# Patient Record
Sex: Female | Born: 1975 | Race: White | Hispanic: No | Marital: Married | Smoking: Never smoker
Health system: Southern US, Community
[De-identification: ages and names within clinical notes are randomized; demographics above are authoritative.]

## PROBLEM LIST (undated history)

## (undated) HISTORY — PX: APPENDECTOMY: SHX54

---

## 2004-03-06 ENCOUNTER — Other Ambulatory Visit: Admission: RE | Admit: 2004-03-06 | Discharge: 2004-03-06 | Payer: Self-pay | Admitting: Family Medicine

## 2005-03-06 ENCOUNTER — Other Ambulatory Visit: Admission: RE | Admit: 2005-03-06 | Discharge: 2005-03-06 | Payer: Self-pay | Admitting: Family Medicine

## 2006-01-04 ENCOUNTER — Ambulatory Visit (HOSPITAL_COMMUNITY): Admission: RE | Admit: 2006-01-04 | Discharge: 2006-01-04 | Payer: Self-pay | Admitting: Family Medicine

## 2006-01-04 ENCOUNTER — Ambulatory Visit: Payer: Self-pay | Admitting: Family Medicine

## 2006-01-08 ENCOUNTER — Ambulatory Visit: Payer: Self-pay | Admitting: Family Medicine

## 2006-01-15 ENCOUNTER — Ambulatory Visit: Payer: Self-pay | Admitting: Family Medicine

## 2006-10-03 ENCOUNTER — Inpatient Hospital Stay (HOSPITAL_COMMUNITY): Admission: AD | Admit: 2006-10-03 | Discharge: 2006-10-06 | Payer: Self-pay | Admitting: Obstetrics and Gynecology

## 2006-10-08 ENCOUNTER — Inpatient Hospital Stay (HOSPITAL_COMMUNITY): Admission: AD | Admit: 2006-10-08 | Discharge: 2006-10-08 | Payer: Self-pay | Admitting: Obstetrics and Gynecology

## 2007-05-05 ENCOUNTER — Ambulatory Visit: Payer: Self-pay | Admitting: Family Medicine

## 2008-05-10 ENCOUNTER — Inpatient Hospital Stay (HOSPITAL_COMMUNITY): Admission: RE | Admit: 2008-05-10 | Discharge: 2008-05-12 | Payer: Self-pay | Admitting: Obstetrics and Gynecology

## 2008-11-28 ENCOUNTER — Ambulatory Visit: Payer: Self-pay | Admitting: Family Medicine

## 2008-12-18 ENCOUNTER — Ambulatory Visit: Payer: Self-pay | Admitting: Family Medicine

## 2010-04-15 LAB — CBC
HCT: 31.4 % — ABNORMAL LOW (ref 36.0–46.0)
HCT: 36.4 % (ref 36.0–46.0)
Hemoglobin: 11.3 g/dL — ABNORMAL LOW (ref 12.0–15.0)
MCHC: 36 g/dL (ref 30.0–36.0)
MCV: 86.4 fL (ref 78.0–100.0)
Platelets: 194 10*3/uL (ref 150–400)
RBC: 3.63 MIL/uL — ABNORMAL LOW (ref 3.87–5.11)
RDW: 13.6 % (ref 11.5–15.5)
WBC: 13.5 10*3/uL — ABNORMAL HIGH (ref 4.0–10.5)

## 2010-04-15 LAB — RPR: RPR Ser Ql: NONREACTIVE

## 2010-05-20 NOTE — Discharge Summary (Signed)
NAME:  Natalie Davis, Natalie Davis NO.:  [id number]   MEDICAL RECORD NO.:  [id number]          PATIENT TYPE:  INP   LOCATION:  9115                          FACILITY:  [facility]   PHYSICIAN:  Sherron Monday, MD        DATE OF BIRTH:  Sep 04, 1975   DATE OF ADMISSION:  05/10/2008  DATE OF DISCHARGE:  05/12/2008                               DISCHARGE SUMMARY   ADMITTING DIAGNOSIS:  Intrauterine pregnancy at term with favorable  cervix.   DISCHARGE DIAGNOSIS:  Intrauterine pregnancy at term with favorable  cervix, delivered via spontaneous vaginal delivery with moderate  shoulder dystocia.   HISTORY OF PRESENT ILLNESS:  A 35 year old G3, P1-0-1-1 at 39+ weeks.  [facility] May 15, 2008 by LMP consistent with 9-week ultrasound presents  to labor and delivery for induction given 39 weeks status and favorable  cervix.  Prenatal care was uncomplicated aside from a marginal previa  that has resolved.  Positive group B strep status.   PAST MEDICAL HISTORY:  Not significant.   PAST SURGICAL HISTORY:  Significant for an appendectomy in 1999.   PAST OBSTETRICAL/GYNECOLOGICAL HISTORY:  She has had a miscarriage as  well as a vaginal delivery of 7 pounds 1 ounce infant in 2008 and as  well as the present pregnancy.  No abnormal Pap smears or sexually  transmitted diseases.   MEDICATIONS:  Prenatal vitamins and Tums.   ALLERGIES:  No known drug allergies.   SOCIAL HISTORY:  Denies alcohol, tobacco, or drug use.   PRENATAL LABORATORY DATA:  A positive, antibody screen negative, rubella  immune, hepatitis B surface antigen negative, HIV negative.  Gonorrhea  and chlamydia negative, RPR nonreactive.  Group B strep positive.  One-  hour GTT on a normal limits.  First trimester screen within normal  limits.   On admission, she is afebrile.  Vital signs stable and a benign exam  with fetal heart tones that were reactive.  An AROM was performed for  clear fluid.  She was given penicillin for group  positive, group B  strep, status post as well as Pitocin to augment her labor.  She  progressed to complete, complete and pushed for approximately 15 minutes  with the descent of vertex and delivery of the head.  She had a nuchal  cord x1 which was reduced over the head and moderate shoulder dystocia  with delivery with McRoberts and suprapubic as well as a posterior  axillary lift.  The baby with Apgars of 4 at 1 and 9 and 5 minutes and  weight of 8 pounds 4 ounces.  First-degree perineal laceration was  repaired with 2-0 Vicryl and following delivery the baby was moving her  arms well.  There was bruising noted on face and arm and back.  Her  postpartum course was relatively uncomplicated.  She remained afebrile.  Vital signs stable throughout.  Her hemoglobin decreased from 12.7-11.3.  She is A+ and rubella immune.  On the day of discharge, she was  tolerating p.o.  Her pain was well controlled.  She had normal lochia  and benign exam.  She  was  discharged home with routine discharge instructions and numbers to call  with any questions or problems.  She was given prescriptions for Motrin,  Vicodin, and prenatal vitamins.  She will follow up in approximately 6  weeks and plans to breast feed.  She will discuss contraception and  checkup with Dr. Senaida Ores.       Sherron Monday, MD  Electronically Signed     JB/MEDQ  D:  05/12/2008  T:  05/12/2008  Job:  [id number]

## 2010-05-23 NOTE — Discharge Summary (Signed)
NAME:  MEIGHAN, TRETO NO.:  [id number]   MEDICAL RECORD NO.:  [id number]          PATIENT TYPE:  INP   LOCATION:  9120                          [facility]ACILITY:  [facility]   PHYSICIAN:  Huel Cote, M.D. DATE OF BIRTH:  1975/04/12   DATE OF ADMISSION:  10/03/2006  DATE OF DISCHARGE:  10/06/2006                               DISCHARGE SUMMARY   DISCHARGE DIAGNOSES:  1. Term pregnancy at 38-5/7 weeks, delivered.  2. Status post normal spontaneous vaginal delivery.   DISCHARGE MEDICATIONS:  1. Motrin 600 mg p.o. every 6 hours.  2. Percocet 1-2 tablets p.o. every 4 hours.  3. Flexeril 10 mg p.o. x1.   DISCHARGE FOLLOWUP:  The patient is to follow up in my office in 6 weeks  for her routine postpartum exam.   HOSPITAL COURSE:  The patient is a 35 year old G2, P0-0-1-0 who was  admitted at 38-5/[redacted] weeks gestation with complaint of contractions every  3 minutes.  She was uncomfortable but was doing well without pain  medications and was trying to go natural.  Prenatal care was  uncomplicated except for positive group B strep status.  Prenatal labs  were as follows, A positive, antibody negative, RPR nonreactive, rubella  immune, hepatitis B surface antigen negative, HIV negative, GC negative,  chlamydia negative, group B strep positive, 1 hour Glucola 123.   PAST OBSTETRICAL HISTORY:  She had one spontaneous miscarriage.   PAST MEDICAL HISTORY:  None.   PAST GYN HISTORY:  None.   PAST SURGICAL HISTORY:  In 1989 she had an appendectomy.   On admission she was afebrile with stable vital signs.  Fetal heart rate  was reactive.  Contractions were every 2-8 minutes.  Cervix was 70 and 2  and a -2 station with intact membranes.  She was placed on penicillin  for a positive group B strep status.  We discussed in detail the  patient's wishes which included minimal intervention.  She did not want  rupture of membranes or Pitocin.  We discussed the option of going back  home  versus staying in the hospital with ambulation and intermittent  monitoring given that she really did not want any intervention and she  requested to stay.  She then stayed and for approximately the next 10  hours was really noted to be in latent phase labor.  She had no  intervention with pain medications or other rupture of membranes per her  request.  Cervix progressed only to 80, 2 to 3 and a -2 station.  She  was given the option at this point of discontinuing her IV and going  home with Ambien versus proceeding with rupture of membranes.  She felt  like she could not tolerate going home secondary to a pain level of 7  and agreed to rupture of membranes with Stadol and Phenergan to help her  with the pain.  Fetal heart rate was reactive.  She then had rupture of  membranes and did require some augmentation very minimally with Pitocin.  She received her epidural at 4 cm and progressed to complete dilation.  Early the next  morning at that point she began to push and pushed very  well with a normal spontaneous vaginal delivery of a vigorous female  infant over a first degree laceration.  Apgars were 7 and 9, weight was  7 pounds 1 ounce.  Placenta was delivered spontaneously.  Uterus with  several pieces of trailing membrane which were removed from the fundus.  No other fragments were noted.  First degree laceration was repaired  with 3-0 Vicryl.  Cervix and rectum were intact.  She was then admitted  for routine postpartum care.  Postpartum day #1 she was doing well  without significant complications.  Her hemoglobin was stable at 10.1,  however, on postpartum day #2 the patient was tearful regarding some  severe neck pain which began the evening of postpartum day #1.  She did  not really get any relief from Motrin or Percocet and felt most likely  she was having a significant muscle spasm.  She had no depression or  postpartum blues, just really was uncomfortable.  She was given  Flexeril  x1 to try to improve the pain and if this was not successful was  instructed that we may need to do further workup.  She did get some  relief and went home later that day.  She will follow up in the office  in 6 weeks for her routine postpartum exam.      Huel Cote, M.D.  Electronically Signed     KR/MEDQ  D:  11/17/2006  T:  11/17/2006  Job:  [id number]

## 2010-10-16 LAB — CBC
HCT: 28.5 — ABNORMAL LOW
Hemoglobin: 10.1 — ABNORMAL LOW
MCHC: 35.4
MCHC: 35.5
MCV: 89.2
RDW: 12.8
RDW: 13.1
WBC: 16.6 — ABNORMAL HIGH

## 2010-10-16 LAB — RPR: RPR Ser Ql: NONREACTIVE

## 2014-11-21 ENCOUNTER — Encounter: Payer: Self-pay | Admitting: Family

## 2014-11-21 ENCOUNTER — Ambulatory Visit: Payer: Self-pay | Admitting: Family

## 2014-11-21 VITALS — BP 100/58 | HR 64 | Temp 98.5°F

## 2014-11-21 DIAGNOSIS — L309 Dermatitis, unspecified: Secondary | ICD-10-CM

## 2014-11-21 MED ORDER — PREDNISONE 20 MG PO TABS: 40.0000 mg | ORAL_TABLET | Freq: Every day | ORAL | Status: DC

## 2014-11-21 NOTE — Progress Notes (Signed)
S/ one wk hx of pruritic rash to trunk , distal extremities, spreading , cold sxs . 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 rx prednisone 20 mg 2 qd x 7 . F/u prn

## 2015-01-09 ENCOUNTER — Ambulatory Visit: Payer: Self-pay | Admitting: Physician Assistant

## 2016-04-02 ENCOUNTER — Other Ambulatory Visit: Payer: Self-pay | Admitting: Obstetrics and Gynecology

## 2016-04-02 ENCOUNTER — Encounter: Payer: Self-pay | Admitting: Obstetrics and Gynecology

## 2016-04-02 ENCOUNTER — Ambulatory Visit (INDEPENDENT_AMBULATORY_CARE_PROVIDER_SITE_OTHER): Payer: Self-pay | Admitting: Obstetrics and Gynecology

## 2016-04-02 VITALS — BP 116/65 | HR 63 | Ht 60.0 in | Wt 134.8 lb

## 2016-04-02 DIAGNOSIS — N6019 Diffuse cystic mastopathy of unspecified breast: Secondary | ICD-10-CM | POA: Diagnosis not present

## 2016-04-02 DIAGNOSIS — Z01419 Encounter for gynecological examination (general) (routine) without abnormal findings: Secondary | ICD-10-CM

## 2016-04-02 DIAGNOSIS — Z803 Family history of malignant neoplasm of breast: Secondary | ICD-10-CM

## 2016-04-02 NOTE — Patient Instructions (Signed)
 Preventive Care 18-39 Years, Female Preventive care refers to lifestyle choices and visits with your health care provider that can promote health and wellness. What does preventive care include?  A yearly physical exam. This is also called an annual well check.  Dental exams once or twice a year.  Routine eye exams. Ask your health care provider how often you should have your eyes checked.  Personal lifestyle choices, including:  Daily care of your teeth and gums.  Regular physical activity.  Eating a healthy diet.  Avoiding tobacco and drug use.  Limiting alcohol use.  Practicing safe sex.  Taking vitamin and mineral supplements as recommended by your health care provider. What happens during an annual well check? The services and screenings done by your health care provider during your annual well check will depend on your age, overall health, lifestyle risk factors, and family history of disease. Counseling  Your health care provider may ask you questions about your:  Alcohol use.  Tobacco use.  Drug use.  Emotional well-being.  Home and relationship well-being.  Sexual activity.  Eating habits.  Work and work environment.  Method of birth control.  Menstrual cycle.  Pregnancy history. Screening  You may have the following tests or measurements:  Height, weight, and BMI.  Diabetes screening. This is done by checking your blood sugar (glucose) after you have not eaten for a while (fasting).  Blood pressure.  Lipid and cholesterol levels. These may be checked every 5 years starting at age 20.  Skin check.  Hepatitis C blood test.  Hepatitis B blood test.  Sexually transmitted disease (STD) testing.  BRCA-related cancer screening. This may be done if you have a family history of breast, ovarian, tubal, or peritoneal cancers.  Pelvic exam and Pap test. This may be done every 3 years starting at age 21. Starting at age 30, this may be done  every 5 years if you have a Pap test in combination with an HPV test. Discuss your test results, treatment options, and if necessary, the need for more tests with your health care provider. Vaccines  Your health care provider may recommend certain vaccines, such as:  Influenza vaccine. This is recommended every year.  Tetanus, diphtheria, and acellular pertussis (Tdap, Td) vaccine. You may need a Td booster every 10 years.  Varicella vaccine. You may need this if you have not been vaccinated.  HPV vaccine. If you are 26 or younger, you may need three doses over 6 months.  Measles, mumps, and rubella (MMR) vaccine. You may need at least one dose of MMR. You may also need a second dose.  Pneumococcal 13-valent conjugate (PCV13) vaccine. You may need this if you have certain conditions and were not previously vaccinated.  Pneumococcal polysaccharide (PPSV23) vaccine. You may need one or two doses if you smoke cigarettes or if you have certain conditions.  Meningococcal vaccine. One dose is recommended if you are age 19-21 years and a first-year college student living in a residence hall, or if you have one of several medical conditions. You may also need additional booster doses.  Hepatitis A vaccine. You may need this if you have certain conditions or if you travel or work in places where you may be exposed to hepatitis A.  Hepatitis B vaccine. You may need this if you have certain conditions or if you travel or work in places where you may be exposed to hepatitis B.  Haemophilus influenzae type b (Hib) vaccine. You may need   this if you have certain risk factors. Talk to your health care provider about which screenings and vaccines you need and how often you need them. This information is not intended to replace advice given to you by your health care provider. Make sure you discuss any questions you have with your health care provider. Document Released: 02/17/2001 Document Revised:  09/11/2015 Document Reviewed: 10/23/2014 Elsevier Interactive Patient Education  2017 Elsevier Inc.  

## 2016-04-02 NOTE — Progress Notes (Signed)
Subjective:   Natalie Davis is a 41 y.o. G2P2 Caucasian female here for a routine well-woman exam.  Patient's last menstrual period was 03/13/2016.    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  Past Medical History History reviewed. No pertinent past medical history.  Past Surgical History Past Surgical History:  Procedure Laterality Date  . APPENDECTOMY      Gynecologic History G2P2  Patient's last menstrual period was 03/13/2016. Contraception: vasectomy Last Pap: 2014. Results were: normal   Obstetric History OB History  Gravida Para Term Preterm AB Living  2 2          SAB TAB Ectopic Multiple Live Births               # Outcome Date GA Lbr Len/2nd Weight Sex Delivery Anes PTL Lv  2 Para 2010    F Vag-Spont     1 Para 2008    F Vag-Spont         Current Medications No current outpatient prescriptions on file prior to visit.   No current facility-administered medications on file prior to visit.     Review of Systems Patient denies any headaches, blurred vision, shortness of breath, chest pain, abdominal pain, problems with bowel movements, urination, or intercourse.  Objective:  BP 116/65   Pulse 63   Ht 5' (1.524 m)   Wt 134 lb 12.8 oz (61.1 kg)   LMP 03/13/2016   BMI 26.33 kg/m  Physical Exam  General:  Well developed, well nourished, no acute distress. She is alert and oriented x3. Skin:  Warm and dry Neck:  Midline trachea, no thyromegaly or nodules Cardiovascular: Regular rate and rhythm, no  murmur heard Lungs:  Effort normal, all lung fields clear to auscultation bilaterally Breasts:  No dominant palpable mass, retraction, or nipple discharge, fibrocystic noted bilaterally Abdomen:  Soft, non tender, no hepatosplenomegaly or masses Pelvic:  External genitalia is normal in appearance.  The vagina is normal in appearance. The cervix is bulbous, no CMT.  Thin prep pap is done with HR HPV cotesting. Uterus is felt to be normal size, shape, and contour.  No adnexal masses or tenderness noted. Extremities:  No swelling or varicosities noted Psych:  She has a normal mood and affect  Assessment:   Healthy well-woman exam Fibrocystic breast Family history of breast cancer in MGM  Plan:  Routine screening labs to be obtained at work at annual screening. F/U 1 year for AE, or sooner if needed Mammogram ordered baseline  Natalie Davis Suzan NailerN Elvenia Godden, CNM

## 2016-04-07 LAB — CYTOLOGY - PAP

## 2017-05-06 ENCOUNTER — Ambulatory Visit (INDEPENDENT_AMBULATORY_CARE_PROVIDER_SITE_OTHER): Payer: Managed Care, Other (non HMO) | Admitting: Obstetrics and Gynecology

## 2017-05-06 ENCOUNTER — Encounter: Payer: Self-pay | Admitting: Obstetrics and Gynecology

## 2017-05-06 VITALS — BP 107/56 | HR 54 | Ht 60.0 in | Wt 132.5 lb

## 2017-05-06 DIAGNOSIS — R001 Bradycardia, unspecified: Secondary | ICD-10-CM | POA: Diagnosis not present

## 2017-05-06 DIAGNOSIS — Z01419 Encounter for gynecological examination (general) (routine) without abnormal findings: Secondary | ICD-10-CM | POA: Diagnosis not present

## 2017-05-06 DIAGNOSIS — Z8249 Family history of ischemic heart disease and other diseases of the circulatory system: Secondary | ICD-10-CM | POA: Diagnosis not present

## 2017-05-06 NOTE — Progress Notes (Signed)
Subjective:   Natalie Davis is a 42 y.o. G2P2 Caucasian female here for a routine well-woman exam.  Patient's last menstrual period was 05/01/2017.    Current complaints: bio-metric screening at work revealed persistent low pulse and BP. Feels tired at times. Is exercising.and denies dizziness. Menses are monthly with cramping and heaviness the first 2 days. 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 Tobacco/alcohol: no tobacco use Illicit drugs: no history of illicit drug use  The following portions of the patient's history were reviewed and updated as appropriate: allergies, current medications, past family history, past medical history, past social history, past surgical history and problem list.  Past Medical History History reviewed. No pertinent past medical history.  Past Surgical History Past Surgical History:  Procedure Laterality Date  . APPENDECTOMY      Gynecologic History G2P2  Patient's last menstrual period was 05/01/2017. Contraception: vasectomy Last Pap: 03/2016. Results were: normal   Obstetric History OB History  Gravida Para Term Preterm AB Living  2 2          SAB TAB Ectopic Multiple Live Births               # Outcome Date GA Lbr Len/2nd Weight Sex Delivery Anes PTL Lv  2 Para 2010    F Vag-Spont     1 Para 2008    F Vag-Spont       Current Medications No current outpatient medications on file prior to visit.   No current facility-administered medications on file prior to visit.     Review of Systems Patient denies any headaches, blurred vision, shortness of breath, chest pain, abdominal pain, problems with bowel movements, urination, or intercourse.  Objective:  BP (!) 107/56   Pulse (!) 54   Ht 5' (1.524 m)   Wt 132 lb 8 oz (60.1 kg)   LMP 05/01/2017   BMI 25.88 kg/m  Physical Exam  General:   Well developed, well nourished, no acute distress. She is alert and oriented x3. Skin:  Warm and dry Neck:  Midline trachea, no thyromegaly or nodules Cardiovascular: Regular rate and rhythm, no murmur heard Lungs:  Effort normal, all lung fields clear to auscultation bilaterally Breasts:  No dominant palpable mass, retraction, or nipple discharge Abdomen:  Soft, non tender, no hepatosplenomegaly or masses Pelvic:  External genitalia is normal in appearance.  The vagina is normal in appearance. The cervix is bulbous, no CMT.  Thin prep pap is not done. Uterus is felt to be normal size, shape, and contour.  No adnexal masses or tenderness noted. Extremities:  No swelling or varicosities noted Psych:  She has a normal mood and affect  Assessment:   Healthy well-woman exam Bradycardia Family history of recurrent blood clots in sister Family history of breast cancer in MGM  Plan:  Labs obtained (at [facility]) will follow up accordingly F/U 1 year for AE, or sooner if needed   Danae Oland Suzan Nailer, CNM

## 2017-05-06 NOTE — Patient Instructions (Signed)
Bradycardia, Adult °Bradycardia is a slower-than-normal heartbeat. A normal resting heart rate for an adult ranges from 60 to 100 beats per minute. With bradycardia, the resting heart rate is less than 60 beats per minute. °Bradycardia can prevent enough oxygen from reaching certain areas of your body when you are active. It can be serious if it keeps enough oxygen from reaching your brain and other parts of your body. Bradycardia is not a problem for everyone. For some healthy adults, a slow resting heart rate is normal. °What are the causes? °This condition may be caused by: °· A problem with the heart, including: °? A problem with the heart's electrical system, such as a heart block. °? A problem with the heart's natural pacemaker (sinus node). °? Heart disease. °? A heart attack. °? Heart damage. °? A heart infection. °? A heart condition that is present at birth (congenital heart defect). °· Certain medicines that treat heart conditions. °· Certain conditions, such as hypothyroidism and obstructive sleep apnea. °· Problems with the balance of chemicals and other substances, like potassium, in the blood. ° °What increases the risk? °This condition is more likely to develop in adults who: °· Are age 65 or older. °· Have high blood pressure (hypertension), high cholesterol (hyperlipidemia), or diabetes. °· Drink heavily, use tobacco or nicotine products, or use drugs. °· Are stressed. ° °What are the signs or symptoms? °Symptoms of this condition include: °· Light-headedness. °· Feeling faint or fainting. °· Fatigue and weakness. °· Shortness of breath. °· Chest pain (angina). °· Drowsiness. °· Confusion. °· Dizziness. ° °How is this diagnosed? °This condition may be diagnosed based on: °· Your symptoms. °· Your medical history. °· A physical exam. ° °During the exam, your health care provider will listen to your heartbeat and check your pulse. To confirm the diagnosis, your health care provider may order tests,  such as: °· Blood tests. °· An electrocardiogram (ECG). This test records the heart's electrical activity. The test can show how fast your heart is beating and whether the heartbeat is steady. °· A test in which you wear a portable device (event recorder or Holter monitor) to record your heart's electrical activity while you go about your day. °· An exercise test. ° °How is this treated? °Treatment for this condition depends on the cause of the condition and how severe your symptoms are. Treatment may involve: °· Treatment of the underlying condition. °· Changing your medicines or how much medicine you take. °· Having a small, battery-operated device called a pacemaker implanted under the skin. When bradycardia occurs, this device can be used to increase your heart rate and help your heart to beat in a regular rhythm. ° °Follow these instructions at home: °Lifestyle ° °· Manage any health conditions that contribute to bradycardia as told by your health care provider. °· Follow a heart-healthy diet. A nutrition specialist (dietitian) can help to educate you about healthy food options and changes. °· Follow an exercise program that is approved by your health care provider. °· Maintain a healthy weight. °· Try to reduce or manage your stress, such as with yoga or meditation. If you need help reducing stress, ask your health care provider. °· Do not use use any products that contain nicotine or tobacco, such as cigarettes and e-cigarettes. If you need help quitting, ask your health care provider. °· Do not use illegal drugs. °· Limit alcohol intake to no more than 1 drink per day for nonpregnant women and 2 drinks per   day for men. One drink equals 12 oz of beer, 5 oz of wine, or 1½ oz of hard liquor. °General instructions °· Take over-the-counter and prescription medicines only as told by your health care provider. °· Keep all follow-up visits as directed by your health care provider. This is important. °How is this  prevented? °In some cases, bradycardia may be prevented by: °· Treating underlying medical problems. °· Stopping behaviors or medicines that can trigger the condition. ° °Contact a health care provider if: °· You feel light-headed or dizzy. °· You almost faint. °· You feel weak or are easily fatigued during physical activity. °· You experience confusion or have memory problems. °Get help right away if: °· You faint. °· You have an irregular heartbeat (palpitations). °· You have chest pain. °· You have trouble breathing. °This information is not intended to replace advice given to you by your health care provider. Make sure you discuss any questions you have with your health care provider. °Document Released: 09/13/2001 Document Revised: 08/20/2015 Document Reviewed: 06/13/2015 °Elsevier Interactive Patient Education © 2017 Elsevier Inc. ° °

## 2017-05-19 ENCOUNTER — Other Ambulatory Visit: Payer: Self-pay

## 2017-05-19 DIAGNOSIS — Z8249 Family history of ischemic heart disease and other diseases of the circulatory system: Secondary | ICD-10-CM

## 2017-05-19 DIAGNOSIS — R001 Bradycardia, unspecified: Secondary | ICD-10-CM

## 2017-05-20 ENCOUNTER — Other Ambulatory Visit: Payer: Self-pay | Admitting: Obstetrics and Gynecology

## 2017-05-20 ENCOUNTER — Telehealth: Payer: Self-pay | Admitting: *Deleted

## 2017-05-20 DIAGNOSIS — E559 Vitamin D deficiency, unspecified: Secondary | ICD-10-CM

## 2017-05-20 LAB — CBC WITH DIFFERENTIAL/PLATELET
BASOS ABS: 0 10*3/uL (ref 0.0–0.2)
BASOS: 0 %
EOS (ABSOLUTE): 0.2 10*3/uL (ref 0.0–0.4)
Eos: 4 %
Hematocrit: 42.8 % (ref 34.0–46.6)
Hemoglobin: 14 g/dL (ref 11.1–15.9)
IMMATURE GRANULOCYTES: 0 %
Immature Grans (Abs): 0 10*3/uL (ref 0.0–0.1)
LYMPHS: 37 %
Lymphocytes Absolute: 2.2 10*3/uL (ref 0.7–3.1)
MCH: 30.2 pg (ref 26.6–33.0)
MCHC: 32.7 g/dL (ref 31.5–35.7)
MCV: 92 fL (ref 79–97)
MONOCYTES: 5 %
Monocytes Absolute: 0.3 10*3/uL (ref 0.1–0.9)
NEUTROS ABS: 3.2 10*3/uL (ref 1.4–7.0)
NEUTROS PCT: 54 %
Platelets: 302 10*3/uL (ref 150–379)
RBC: 4.64 x10E6/uL (ref 3.77–5.28)
RDW: 12.9 % (ref 12.3–15.4)
WBC: 5.9 10*3/uL (ref 3.4–10.8)

## 2017-05-20 LAB — COMPREHENSIVE METABOLIC PANEL
A/G RATIO: 2 (ref 1.2–2.2)
ALT: 9 IU/L (ref 0–32)
AST: 17 IU/L (ref 0–40)
Albumin: 4.7 g/dL (ref 3.5–5.5)
Alkaline Phosphatase: 43 IU/L (ref 39–117)
BILIRUBIN TOTAL: 0.5 mg/dL (ref 0.0–1.2)
BUN/Creatinine Ratio: 13 (ref 9–23)
BUN: 10 mg/dL (ref 6–20)
CALCIUM: 9.4 mg/dL (ref 8.7–10.2)
CHLORIDE: 102 mmol/L (ref 96–106)
CO2: 20 mmol/L (ref 20–29)
Creatinine, Ser: 0.78 mg/dL (ref 0.57–1.00)
GFR calc Af Amer: 111 mL/min/{1.73_m2} (ref >59)
GFR calc non Af Amer: 96 mL/min/{1.73_m2} (ref >59)
GLUCOSE: 81 mg/dL (ref 65–99)
Globulin, Total: 2.4 g/dL (ref 1.5–4.5)
POTASSIUM: 4.2 mmol/L (ref 3.5–5.2)
Sodium: 140 mmol/L (ref 134–144)
Total Protein: 7.1 g/dL (ref 6.0–8.5)

## 2017-05-20 LAB — B12 AND FOLATE PANEL
Folate: >20 ng/mL (ref >3.0)
Vitamin B-12: 370 pg/mL (ref 232–1245)

## 2017-05-20 LAB — PT AND PTT

## 2017-05-20 LAB — FERRITIN: FERRITIN: 31 ng/mL (ref 15–150)

## 2017-05-20 LAB — FIBRINOGEN

## 2017-05-20 LAB — LIPID PANEL
CHOLESTEROL TOTAL: 183 mg/dL (ref 100–199)
Chol/HDL Ratio: 2.6 ratio (ref 0.0–4.4)
HDL: 70 mg/dL (ref >39)
LDL Calculated: 90 mg/dL (ref 0–99)
Triglycerides: 116 mg/dL (ref 0–149)
VLDL Cholesterol Cal: 23 mg/dL (ref 5–40)

## 2017-05-20 LAB — VITAMIN D 25 HYDROXY (VIT D DEFICIENCY, FRACTURES): VIT D 25 HYDROXY: 19.9 ng/mL — AB (ref 30.0–100.0)

## 2017-05-20 LAB — THYROID PANEL WITH TSH
Free Thyroxine Index: 1.7 (ref 1.2–4.9)
T3 Uptake Ratio: 21 % — ABNORMAL LOW (ref 24–39)
T4, Total: 7.9 ug/dL (ref 4.5–12.0)
TSH: 2.75 u[IU]/mL (ref 0.450–4.500)

## 2017-05-20 MED ORDER — VITAMIN D (ERGOCALCIFEROL) 1.25 MG (50000 UNIT) PO CAPS: 50000.0000 [IU] | ORAL_CAPSULE | ORAL | 1 refills | Status: DC

## 2017-05-20 NOTE — Telephone Encounter (Signed)
-----   Message from Purcell Nails, [place] sent at 05/20/2017  8:23 AM EDT ----- Please mail infor on vit D and schedule recheck lab in 3 months

## 2017-05-20 NOTE — Telephone Encounter (Signed)
Mailed all info 

## 2017-09-01 ENCOUNTER — Telehealth: Payer: Self-pay | Admitting: Obstetrics and Gynecology

## 2017-09-01 NOTE — Telephone Encounter (Signed)
The patient sent a MyChart Schedule Message; see below:  Comments:  I just need an order to take over to employee health for vitamin d recheck.    Please advise, thanks

## 2017-09-02 ENCOUNTER — Other Ambulatory Visit: Payer: Self-pay | Admitting: *Deleted

## 2017-09-02 ENCOUNTER — Encounter: Payer: Self-pay | Admitting: *Deleted

## 2017-09-02 DIAGNOSIS — E559 Vitamin D deficiency, unspecified: Secondary | ICD-10-CM

## 2018-05-12 ENCOUNTER — Encounter: Payer: Managed Care, Other (non HMO) | Admitting: Obstetrics and Gynecology

## 2018-06-21 ENCOUNTER — Ambulatory Visit (INDEPENDENT_AMBULATORY_CARE_PROVIDER_SITE_OTHER): Payer: Managed Care, Other (non HMO) | Admitting: Obstetrics and Gynecology

## 2018-06-21 ENCOUNTER — Encounter: Payer: Self-pay | Admitting: Obstetrics and Gynecology

## 2018-06-21 ENCOUNTER — Other Ambulatory Visit: Payer: Self-pay

## 2018-06-21 VITALS — BP 112/65 | HR 52 | Ht 60.0 in | Wt 133.7 lb

## 2018-06-21 DIAGNOSIS — Z01419 Encounter for gynecological examination (general) (routine) without abnormal findings: Secondary | ICD-10-CM

## 2018-06-21 NOTE — Progress Notes (Signed)
Subjective:   Natalie Davis is a 43 y.o. G9P2 Caucasian female here for a routine well-woman exam.  Patient's last menstrual period was 05/31/2018.    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 Tobacco/alcohol: no tobacco use Illicit drugs: no history of illicit drug use  The following portions of the patient's history were reviewed and updated as appropriate: allergies, current medications, past family history, past medical history, past social history, past surgical history and problem list.  Past Medical History History reviewed. No pertinent past medical history.  Past Surgical History Past Surgical History:  Procedure Laterality Date  . APPENDECTOMY      Gynecologic History G2P2  Patient's last menstrual period was 05/31/2018. Contraception: vasectomy Last Pap: 03/2016. Results were: normal   Obstetric History OB History  Gravida Para Term Preterm AB Living  2 2          SAB TAB Ectopic Multiple Live Births               # Outcome Date GA Lbr Len/2nd Weight Sex Delivery Anes PTL Lv  2 Para 2010    F Vag-Spont     1 Para 2008    F Vag-Spont       Current Medications Current Outpatient Medications on File Prior to Visit  Medication Sig Dispense Refill  . Vitamin D, Ergocalciferol, (DRISDOL) 50000 units CAPS capsule Take 1 capsule (50,000 Units total) by mouth 2 (two) times a week. (Patient not taking: Reported on 06/21/2018) 30 capsule 1   No current facility-administered medications on file prior to visit.     Review of Systems Patient denies any headaches, blurred vision, shortness of breath, chest pain, abdominal pain, problems with bowel movements, urination, or intercourse.  Objective:  BP 112/65   Pulse (!) 52   Ht 5' (1.524 m)   Wt 133 lb 11.2 oz (60.6 kg)   LMP 05/31/2018   BMI 26.11  kg/m  Physical Exam  General:  Well developed, well nourished, no acute distress. She is alert and oriented x3. Skin:  Warm and dry Neck:  Midline trachea, no thyromegaly or nodules Cardiovascular: Regular rate and rhythm, no murmur heard Lungs:  Effort normal, all lung fields clear to auscultation bilaterally Breasts:  No dominant palpable mass, retraction, or nipple discharge Abdomen:  Soft, non tender, no hepatosplenomegaly or masses Pelvic:  External genitalia is normal in appearance.  The vagina is normal in appearance. The cervix is bulbous, no CMT.  Thin prep pap is not done. Uterus is felt to be normal size, shape, and contour.  No adnexal masses or tenderness noted. Extremities:  No swelling or varicosities noted Psych:  She has a normal mood and affect  Assessment:   Healthy well-woman exam Vit D deficiency  Plan:  Labs obtained through work, orders sent with her.  Discussed de-stressing after work. F/U 1 year for AE, or sooner if needed Mammogram ordered  Juanluis Guastella Rockney Ghee, CNM

## 2018-06-21 NOTE — Patient Instructions (Signed)
Place annual gynecologic exam patient instructions here.

## 2018-08-12 ENCOUNTER — Other Ambulatory Visit: Payer: Managed Care, Other (non HMO)

## 2018-08-12 ENCOUNTER — Other Ambulatory Visit: Payer: Self-pay

## 2018-08-12 DIAGNOSIS — Z008 Encounter for other general examination: Secondary | ICD-10-CM | POA: Diagnosis not present

## 2018-08-12 DIAGNOSIS — Z01419 Encounter for gynecological examination (general) (routine) without abnormal findings: Secondary | ICD-10-CM | POA: Diagnosis not present

## 2018-08-12 NOTE — Progress Notes (Signed)
Patient had a physical completed with her GYN. Biometric Form was filled out and Labs with orders were drawn.

## 2018-08-13 LAB — LIPID PANEL
Chol/HDL Ratio: 2.8 ratio (ref 0.0–4.4)
Cholesterol, Total: 180 mg/dL (ref 100–199)
HDL: 64 mg/dL (ref >39)
LDL Calculated: 97 mg/dL (ref 0–99)
Triglycerides: 93 mg/dL (ref 0–149)
VLDL Cholesterol Cal: 19 mg/dL (ref 5–40)

## 2018-08-13 LAB — TSH: TSH: 3.71 u[IU]/mL (ref 0.450–4.500)

## 2018-08-13 LAB — COMPREHENSIVE METABOLIC PANEL
ALT: 12 IU/L (ref 0–32)
AST: 17 IU/L (ref 0–40)
Albumin/Globulin Ratio: 2.1 (ref 1.2–2.2)
Albumin: 4.7 g/dL (ref 3.8–4.8)
Alkaline Phosphatase: 41 IU/L (ref 39–117)
BUN/Creatinine Ratio: 26 — ABNORMAL HIGH (ref 9–23)
BUN: 19 mg/dL (ref 6–24)
Bilirubin Total: 0.3 mg/dL (ref 0.0–1.2)
CO2: 20 mmol/L (ref 20–29)
Calcium: 9.5 mg/dL (ref 8.7–10.2)
Chloride: 105 mmol/L (ref 96–106)
Creatinine, Ser: 0.74 mg/dL (ref 0.57–1.00)
GFR calc Af Amer: 117 mL/min/{1.73_m2} (ref >59)
GFR calc non Af Amer: 102 mL/min/{1.73_m2} (ref >59)
Globulin, Total: 2.2 g/dL (ref 1.5–4.5)
Glucose: 88 mg/dL (ref 65–99)
Potassium: 4.6 mmol/L (ref 3.5–5.2)
Sodium: 139 mmol/L (ref 134–144)
Total Protein: 6.9 g/dL (ref 6.0–8.5)

## 2018-08-13 LAB — HGB A1C W/O EAG: Hgb A1c MFr Bld: 4.8 % (ref 4.8–5.6)

## 2018-08-13 LAB — VITAMIN D 25 HYDROXY (VIT D DEFICIENCY, FRACTURES): Vit D, 25-Hydroxy: 27.4 ng/mL — ABNORMAL LOW (ref 30.0–100.0)

## 2018-08-17 ENCOUNTER — Other Ambulatory Visit: Payer: Self-pay | Admitting: Obstetrics and Gynecology

## 2018-08-17 MED ORDER — VITAMIN D (ERGOCALCIFEROL) 1.25 MG (50000 UNIT) PO CAPS: 50000.0000 [IU] | ORAL_CAPSULE | ORAL | 1 refills | Status: DC

## 2018-11-23 ENCOUNTER — Other Ambulatory Visit: Payer: Self-pay | Admitting: Certified Nurse Midwife

## 2018-11-23 ENCOUNTER — Other Ambulatory Visit: Payer: Self-pay | Admitting: Family

## 2018-11-23 ENCOUNTER — Ambulatory Visit
Admission: RE | Admit: 2018-11-23 | Discharge: 2018-11-23 | Disposition: A | Discharge status: Home / Self Care | Payer: Managed Care, Other (non HMO) | Source: Ambulatory Visit | Attending: Obstetrics and Gynecology | Admitting: Obstetrics and Gynecology

## 2018-11-23 DIAGNOSIS — R928 Other abnormal and inconclusive findings on diagnostic imaging of breast: Secondary | ICD-10-CM

## 2018-11-23 DIAGNOSIS — Z1231 Encounter for screening mammogram for malignant neoplasm of breast: Secondary | ICD-10-CM | POA: Insufficient documentation

## 2018-11-23 DIAGNOSIS — Z01419 Encounter for gynecological examination (general) (routine) without abnormal findings: Secondary | ICD-10-CM

## 2018-11-23 DIAGNOSIS — N6489 Other specified disorders of breast: Secondary | ICD-10-CM

## 2018-11-30 ENCOUNTER — Ambulatory Visit
Admission: RE | Admit: 2018-11-30 | Discharge: 2018-11-30 | Disposition: A | Discharge status: Home / Self Care | Payer: Managed Care, Other (non HMO) | Source: Ambulatory Visit | Attending: Certified Nurse Midwife | Admitting: Certified Nurse Midwife

## 2018-11-30 DIAGNOSIS — R928 Other abnormal and inconclusive findings on diagnostic imaging of breast: Secondary | ICD-10-CM | POA: Diagnosis present

## 2018-11-30 DIAGNOSIS — N6489 Other specified disorders of breast: Secondary | ICD-10-CM | POA: Diagnosis present

## 2019-03-27 ENCOUNTER — Encounter: Payer: Self-pay | Admitting: Certified Nurse Midwife

## 2019-03-27 ENCOUNTER — Ambulatory Visit: Payer: Managed Care, Other (non HMO) | Admitting: Certified Nurse Midwife

## 2019-03-27 ENCOUNTER — Other Ambulatory Visit: Payer: Self-pay

## 2019-03-27 VITALS — BP 100/53 | HR 67 | Ht 60.0 in | Wt 138.1 lb

## 2019-03-27 DIAGNOSIS — F419 Anxiety disorder, unspecified: Secondary | ICD-10-CM

## 2019-03-27 NOTE — Progress Notes (Signed)
Pt present for anxiety. Pt GAD-7= 15. Pt stated that she has a lot going on right now and is having a lot of anxiety.

## 2019-03-27 NOTE — Progress Notes (Signed)
GYN ENCOUNTER NOTE  Subjective:       Natalie Davis is a 44 y.o. G2P2 female here for management of anxiety.   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 Reports difficulty relaxing, inability to feel settles, scattered thoughts and difficulty focusing and trouble sleeping for the last few months.   No SI/HI.    Gynecologic History  Patient's last menstrual period was 03/03/2019.  Contraception: vasectomy  Last Pap: 03/2016. Results were: Negative/Negative  Last mammogram: 11/2018. Results were: abnormal, BI-RADS 3, follow up in six (6) months  Obstetric History  OB History  Gravida Para Term Preterm AB Living  2 2          SAB TAB Ectopic Multiple Live Births               # Outcome Date GA Lbr Len/2nd Weight Sex Delivery Anes PTL Lv  2 Para 2010    F Vag-Spont     1 Para 2008    F Vag-Spont       History reviewed. No pertinent past medical history.  Past Surgical History:  Procedure Laterality Date  . APPENDECTOMY      Current Outpatient Medications on File Prior to Visit  Medication Sig Dispense Refill  . Vitamin D, Ergocalciferol, (DRISDOL) 1.25 MG (50000 UT) CAPS capsule Take 1 capsule (50,000 Units total) by mouth 2 (two) times a week. 30 capsule 1   No current facility-administered medications on file prior to visit.    No Known Allergies  Social History   Socioeconomic History  . Marital status: Married    Spouse name: Not on file  . Number of children: Not on file  . Years of education: Not on file  . Highest education level: Not on file  Occupational History  . Not on file  Tobacco Use  . Smoking status: Never Smoker  . Smokeless tobacco: Never Used  Substance and Sexual Activity  . Alcohol use: Yes    Alcohol/week: 0.0 standard drinks    Comment: occas  . Drug use: No  . Sexual activity: Yes    Comment: husband vasectomy  Other Topics Concern  . Not on file  Social History Narrative  . Not on file   Social  Determinants of Health   Financial Resource Strain:   . Difficulty of Paying Living Expenses:   Food Insecurity:   . Worried About Charity fundraiser in the Last Year:   . Arboriculturist in the Last Year:   Transportation Needs:   . Film/video editor (Medical):   Marland Kitchen Lack of Transportation (Non-Medical):   Physical Activity:   . Days of Exercise per Week:   . Minutes of Exercise per Session:   Stress:   . Feeling of Stress :   Social Connections:   . Frequency of Communication with Friends and Family:   . Frequency of Social Gatherings with Friends and Family:   . Attends Religious Services:   . Active Member of Clubs or Organizations:   . Attends Archivist Meetings:   Marland Kitchen Marital Status:   Intimate Partner Violence:   . Fear of Current or Ex-Partner:   . Emotionally Abused:   Marland Kitchen Physically Abused:   . Sexually Abused:     Family History  Problem Relation Age of Onset  . Emphysema Paternal Grandfather   . Breast cancer Maternal Grandmother     The following portions of the patient's history were reviewed and updated  as appropriate: allergies, current medications, past family history, past medical history, past social history, past surgical history and problem list.  Review of Systems  ROS negative except as noted above. Information obtained from patient.   Objective:   BP (!) 100/53   Pulse 67   Ht 5' (1.524 m)   Wt 138 lb 1.6 oz (62.6 kg)   LMP 03/03/2019   BMI 26.97 kg/m    CONSTITUTIONAL: Well-developed, well-nourished female in no acute distress.   PHYSICAL EXAM: Not indicated.   GAD 7 : Generalized Anxiety Score 03/27/2019  Nervous, Anxious, on Edge 2  Control/stop worrying 2  Worry too much - different things 2  Trouble relaxing 3  Restless 3  Easily annoyed or irritable 2  Afraid - awful might happen 1  Total GAD 7 Score 15  Anxiety Difficulty Somewhat difficult    Assessment:   1. Anxiety  Plan:   Discussed options for  management of anxiety including self care, counseling, and medication.   Handouts provided. Patient will research medications and contact via MyChart with desired option.   Encouraged to contact [facility].   Reviewed red flag symptoms and when to call.   RTC x 4-6 weeks for televisit medication check or sooner if needed.    Gunnar Bulla, CNM [facility], [facility] 03/27/19 5:24 PM

## 2019-03-27 NOTE — Patient Instructions (Signed)
Hydroxyzine capsules or tablets What is this medicine? HYDROXYZINE (hye [place] i zeen) is an antihistamine. This medicine is used to treat allergy symptoms. It is also used to treat anxiety and tension. This medicine can be used with other medicines to induce sleep before surgery. This medicine may be used for other purposes; ask your health care provider or pharmacist if you have questions. COMMON BRAND NAME(S): ANX, Atarax, Rezine, Vistaril What should I tell my health care provider before I take this medicine? They need to know if you have any of these conditions:  glaucoma  heart disease  history of irregular heartbeat  kidney disease  liver disease  lung or breathing disease, like asthma  stomach or intestine problems  thyroid disease  trouble passing urine  an unusual or allergic reaction to hydroxyzine, cetirizine, other medicines, foods, dyes or preservatives  pregnant or trying to get pregnant  breast-feeding How should I use this medicine? Take this medicine by mouth with a full glass of water. Follow the directions on the prescription label. You may take this medicine with food or on an empty stomach. Take your medicine at regular intervals. Do not take your medicine more often than directed. Talk to your pediatrician regarding the use of this medicine in children. Special care may be needed. While this drug may be prescribed for children as young as 73 years of age for selected conditions, precautions do apply. Patients over 36 years old may have a stronger reaction and need a smaller dose. Overdosage: If you think you have taken too much of this medicine contact a poison control center or emergency room at once. NOTE: This medicine is only for you. Do not share this medicine with others. What if I miss a dose? If you miss a dose, take it as soon as you can. If it is almost time for your next dose, take only that dose. Do not take double or extra doses. What may  interact with this medicine? Do not take this medicine with any of the following medications:  cisapride  dronedarone  pimozide  thioridazine This medicine may also interact with the following medications:  alcohol  antihistamines for allergy, cough, and cold  atropine  barbiturate medicines for sleep or seizures, like phenobarbital  certain antibiotics like erythromycin or clarithromycin  certain medicines for anxiety or sleep  certain medicines for bladder problems like oxybutynin, tolterodine  certain medicines for depression or psychotic disturbances  certain medicines for irregular heart beat  certain medicines for Parkinson's disease like benztropine, trihexyphenidyl  certain medicines for seizures like phenobarbital, primidone  certain medicines for stomach problems like dicyclomine, hyoscyamine  certain medicines for travel sickness like scopolamine  ipratropium  narcotic medicines for pain  other medicines that prolong the QT interval (an abnormal heart rhythm) like dofetilide This list may not describe all possible interactions. Give your health care provider a list of all the medicines, herbs, non-prescription drugs, or dietary supplements you use. Also tell them if you smoke, drink alcohol, or use illegal drugs. Some items may interact with your medicine. What should I watch for while using this medicine? Tell your doctor or health care professional if your symptoms do not improve. You may get drowsy or dizzy. Do not drive, use machinery, or do anything that needs mental alertness until you know how this medicine affects you. Do not stand or sit up quickly, especially if you are an older patient. This reduces the risk of dizzy or fainting spells. Alcohol may  interfere with the effect of this medicine. Avoid alcoholic drinks. Your mouth may get dry. Chewing sugarless gum or sucking hard candy, and drinking plenty of water may help. Contact your doctor if the  problem does not go away or is severe. This medicine may cause dry eyes and blurred vision. If you wear contact lenses you may feel some discomfort. Lubricating drops may help. See your eye doctor if the problem does not go away or is severe. If you are receiving skin tests for allergies, tell your doctor you are using this medicine. What side effects may I notice from receiving this medicine? Side effects that you should report to your doctor or health care professional as soon as possible:  allergic reactions like skin rash, itching or hives, swelling of the face, lips, or tongue  changes in vision  confusion  fast, irregular heartbeat  seizures  tremor  trouble passing urine or change in the amount of urine Side effects that usually do not require medical attention (report to your doctor or health care professional if they continue or are bothersome):  constipation  drowsiness  dry mouth  headache  tiredness This list may not describe all possible side effects. Call your doctor for medical advice about side effects. You may report side effects to FDA at 1-800-FDA-1088. Where should I keep my medicine? Keep out of the reach of children. Store at room temperature between 15 and 30 degrees C (59 and 86 degrees F). Keep container tightly closed. Throw away any unused medicine after the expiration date. NOTE: This sheet is a summary. It may not cover all possible information. If you have questions about this medicine, talk to your doctor, pharmacist, or health care provider.  2020 Elsevier/Gold Standard (2017-12-13 13:19:55)   Escitalopram tablets What is this medicine? ESCITALOPRAM (es sye TAL oh pram) is used to treat depression and certain types of anxiety. This medicine may be used for other purposes; ask your health care provider or pharmacist if you have questions. COMMON BRAND NAME(S): Lexapro What should I tell my health care provider before I take this  medicine? They need to know if you have any of these conditions:  bipolar disorder or a family history of bipolar disorder  diabetes  glaucoma  heart disease  kidney or liver disease  receiving electroconvulsive therapy  seizures (convulsions)  suicidal thoughts, plans, or attempt by you or a family member  an unusual or allergic reaction to escitalopram, the related drug citalopram, other medicines, foods, dyes, or preservatives  pregnant or trying to become pregnant  breast-feeding How should I use this medicine? Take this medicine by mouth with a glass of water. Follow the directions on the prescription label. You can take it with or without food. If it upsets your stomach, take it with food. Take your medicine at regular intervals. Do not take it more often than directed. Do not stop taking this medicine suddenly except upon the advice of your doctor. Stopping this medicine too quickly may cause serious side effects or your condition may worsen. A special MedGuide will be given to you by the pharmacist with each prescription and refill. Be sure to read this information carefully each time. Talk to your pediatrician regarding the use of this medicine in children. Special care may be needed. Overdosage: If you think you have taken too much of this medicine contact a poison control center or emergency room at once. NOTE: This medicine is only for you. Do not share this medicine  with others. What if I miss a dose? If you miss a dose, take it as soon as you can. If it is almost time for your next dose, take only that dose. Do not take double or extra doses. What may interact with this medicine? Do not take this medicine with any of the following medications:  certain medicines for fungal infections like fluconazole, itraconazole, ketoconazole, posaconazole, voriconazole  cisapride  citalopram  dronedarone  linezolid  MAOIs like Carbex, Eldepryl, Marplan, Nardil, and  Parnate  methylene blue (injected into a vein)  pimozide  thioridazine This medicine may also interact with the following medications:  alcohol  amphetamines  aspirin and aspirin-like medicines  carbamazepine  certain medicines for depression, anxiety, or psychotic disturbances  certain medicines for migraine headache like almotriptan, eletriptan, frovatriptan, naratriptan, rizatriptan, sumatriptan, zolmitriptan  certain medicines for sleep  certain medicines that treat or prevent blood clots like warfarin, enoxaparin, dalteparin  cimetidine  diuretics  dofetilide  fentanyl  furazolidone  isoniazid  lithium  metoprolol  NSAIDs, medicines for pain and inflammation, like ibuprofen or naproxen  other medicines that prolong the QT interval (cause an abnormal heart rhythm)  procarbazine  rasagiline  supplements like St. John's wort, kava kava, valerian  tramadol  tryptophan  ziprasidone This list may not describe all possible interactions. Give your health care provider a list of all the medicines, herbs, non-prescription drugs, or dietary supplements you use. Also tell them if you smoke, drink alcohol, or use illegal drugs. Some items may interact with your medicine. What should I watch for while using this medicine? Tell your doctor if your symptoms do not get better or if they get worse. Visit your doctor or health care professional for regular checks on your progress. Because it may take several weeks to see the full effects of this medicine, it is important to continue your treatment as prescribed by your doctor. Patients and their families should watch out for new or worsening thoughts of suicide or depression. Also watch out for sudden changes in feelings such as feeling anxious, agitated, panicky, irritable, hostile, aggressive, impulsive, severely restless, overly excited and hyperactive, or not being able to sleep. If this happens, especially at the  beginning of treatment or after a change in dose, call your health care professional. Dennis Bast may get drowsy or dizzy. Do not drive, use machinery, or do anything that needs mental alertness until you know how this medicine affects you. Do not stand or sit up quickly, especially if you are an older patient. This reduces the risk of dizzy or fainting spells. Alcohol may interfere with the effect of this medicine. Avoid alcoholic drinks. Your mouth may get dry. Chewing sugarless gum or sucking hard candy, and drinking plenty of water may help. Contact your doctor if the problem does not go away or is severe. What side effects may I notice from receiving this medicine? Side effects that you should report to your doctor or health care professional as soon as possible:  allergic reactions like skin rash, itching or hives, swelling of the face, lips, or tongue  anxious  black, tarry stools  changes in vision  confusion  elevated mood, decreased need for sleep, racing thoughts, impulsive behavior  eye pain  fast, irregular heartbeat  feeling faint or lightheaded, falls  feeling agitated, angry, or irritable  hallucination, loss of contact with reality  loss of balance or coordination  loss of memory  painful or prolonged erections  restlessness, pacing, inability to keep  still  seizures  stiff muscles  suicidal thoughts or other mood changes  trouble sleeping  unusual bleeding or bruising  unusually weak or tired  vomiting Side effects that usually do not require medical attention (report to your doctor or health care professional if they continue or are bothersome):  changes in appetite  change in sex drive or performance  headache  increased sweating  indigestion, nausea  tremors This list may not describe all possible side effects. Call your doctor for medical advice about side effects. You may report side effects to FDA at 1-800-FDA-1088. Where should I keep my  medicine? Keep out of reach of children. Store at room temperature between 15 and 30 degrees C (59 and 86 degrees F). Throw away any unused medicine after the expiration date. NOTE: This sheet is a summary. It may not cover all possible information. If you have questions about this medicine, talk to your doctor, pharmacist, or health care provider.  2020 Elsevier/Gold Standard (2017-12-13 11:21:44)   Citalopram tablets What is this medicine? CITALOPRAM (sye TAL oh pram) is a medicine for depression. This medicine may be used for other purposes; ask your health care provider or pharmacist if you have questions. COMMON BRAND NAME(S): Celexa What should I tell my health care provider before I take this medicine? They need to know if you have any of these conditions:  bleeding disorders  bipolar disorder or a family history of bipolar disorder  glaucoma  heart disease  history of irregular heartbeat  kidney disease  liver disease  low levels of magnesium or potassium in the blood  receiving electroconvulsive therapy  seizures  suicidal thoughts, plans, or attempt; a previous suicide attempt by you or a family member  take medicines that treat or prevent blood clots  thyroid disease  an unusual or allergic reaction to citalopram, escitalopram, other medicines, foods, dyes, or preservatives  pregnant or trying to become pregnant  breast-feeding How should I use this medicine? Take this medicine by mouth with a glass of water. Follow the directions on the prescription label. You can take it with or without food. Take your medicine at regular intervals. Do not take your medicine more often than directed. Do not stop taking this medicine suddenly except upon the advice of your doctor. Stopping this medicine too quickly may cause serious side effects or your condition may worsen. A special MedGuide will be given to you by the pharmacist with each prescription and refill. Be  sure to read this information carefully each time. Talk to your pediatrician regarding the use of this medicine in children. Special care may be needed. Patients over 42 years old may have a stronger reaction and need a smaller dose. Overdosage: If you think you have taken too much of this medicine contact a poison control center or emergency room at once. NOTE: This medicine is only for you. Do not share this medicine with others. What if I miss a dose? If you miss a dose, take it as soon as you can. If it is almost time for your next dose, take only that dose. Do not take double or extra doses. What may interact with this medicine? Do not take this medicine with any of the following medications:  certain medicines for fungal infections like fluconazole, itraconazole, ketoconazole, posaconazole, voriconazole  cisapride  dronedarone  escitalopram  linezolid  MAOIs like Carbex, Eldepryl, Marplan, Nardil, and Parnate  methylene blue (injected into a vein)  pimozide  thioridazine This medicine may  also interact with the following medications:  alcohol  amphetamines  aspirin and aspirin-like medicines  carbamazepine  certain medicines for depression, anxiety, or psychotic disturbances  certain medicines for infections like chloroquine, clarithromycin, erythromycin, furazolidone, isoniazid, pentamidine  certain medicines for migraine headaches like almotriptan, eletriptan, frovatriptan, naratriptan, rizatriptan, sumatriptan, zolmitriptan  certain medicines for sleep  certain medicines that treat or prevent blood clots like dalteparin, enoxaparin, warfarin  cimetidine  diuretics  dofetilide  fentanyl  lithium  methadone  metoprolol  NSAIDs, medicines for pain and inflammation, like ibuprofen or naproxen  omeprazole  other medicines that prolong the QT interval (cause an abnormal heart rhythm)  procarbazine  rasagiline  supplements like St. John's wort,  kava kava, valerian  tramadol  tryptophan  ziprasidone This list may not describe all possible interactions. Give your health care provider a list of all the medicines, herbs, non-prescription drugs, or dietary supplements you use. Also tell them if you smoke, drink alcohol, or use illegal drugs. Some items may interact with your medicine. What should I watch for while using this medicine? Tell your doctor if your symptoms do not get better or if they get worse. Visit your doctor or health care professional for regular checks on your progress. Because it may take several weeks to see the full effects of this medicine, it is important to continue your treatment as prescribed by your doctor. Patients and their families should watch out for new or worsening thoughts of suicide or depression. Also watch out for sudden changes in feelings such as feeling anxious, agitated, panicky, irritable, hostile, aggressive, impulsive, severely restless, overly excited and hyperactive, or not being able to sleep. If this happens, especially at the beginning of treatment or after a change in dose, call your health care professional. Bonita Quin may get drowsy or dizzy. Do not drive, use machinery, or do anything that needs mental alertness until you know how this medicine affects you. Do not stand or sit up quickly, especially if you are an older patient. This reduces the risk of dizzy or fainting spells. Alcohol may interfere with the effect of this medicine. Avoid alcoholic drinks. Your mouth may get dry. Chewing sugarless gum or sucking hard candy, and drinking plenty of water will help. Contact your doctor if the problem does not go away or is severe. What side effects may I notice from receiving this medicine? Side effects that you should report to your doctor or health care professional as soon as possible:  allergic reactions like skin rash, itching or hives, swelling of the face, lips, or tongue  anxious  black,  tarry stools  breathing problems  changes in vision  chest pain  confusion  elevated mood, decreased need for sleep, racing thoughts, impulsive behavior  eye pain  fast, irregular heartbeat  feeling faint or lightheaded, falls  feeling agitated, angry, or irritable  hallucination, loss of contact with reality  loss of balance or coordination  loss of memory  painful or prolonged erections  restlessness, pacing, inability to keep still  seizures  stiff muscles  suicidal thoughts or other mood changes  trouble sleeping  unusual bleeding or bruising  unusually weak or tired  vomiting Side effects that usually do not require medical attention (report to your doctor or health care professional if they continue or are bothersome):  change in appetite or weight  change in sex drive or performance  dizziness  headache  increased sweating  indigestion, nausea  tremors This list may not describe all  possible side effects. Call your doctor for medical advice about side effects. You may report side effects to FDA at 1-800-FDA-1088. Where should I keep my medicine? Keep out of reach of children. Store at room temperature between 15 and 30 degrees C (59 and 86 degrees F). Throw away any unused medicine after the expiration date. NOTE: This sheet is a summary. It may not cover all possible information. If you have questions about this medicine, talk to your doctor, pharmacist, or health care provider.  2020 Elsevier/Gold Standard (2017-12-13 09:05:36)   Paroxetine tablets What is this medicine? PAROXETINE (pa ROX e [place]) is used to treat depression. It may also be used to treat anxiety disorders, obsessive compulsive disorder, panic attacks, post traumatic stress, and premenstrual dysphoric disorder (PMDD). This medicine may be used for other purposes; ask your health care provider or pharmacist if you have questions. COMMON BRAND NAME(S): Paxil, Pexeva What  should I tell my health care provider before I take this medicine? They need to know if you have any of these conditions:  bipolar disorder or a family history of bipolar disorder  bleeding disorders  glaucoma  heart disease  kidney disease  liver disease  low levels of sodium in the blood  seizures  suicidal thoughts, plans, or attempt; a previous suicide attempt by you or a family member  take MAOIs like Carbex, Eldepryl, Marplan, Nardil, and Parnate  take medicines that treat or prevent blood clots  thyroid disease  an unusual or allergic reaction to paroxetine, other medicines, foods, dyes, or preservatives  pregnant or trying to get pregnant  breast-feeding How should I use this medicine? Take this medicine by mouth with a glass of water. Follow the directions on the prescription label. You can take it with or without food. Take your medicine at regular intervals. Do not take your medicine more often than directed. Do not stop taking this medicine suddenly except upon the advice of your doctor. Stopping this medicine too quickly may cause serious side effects or your condition may worsen. A special MedGuide will be given to you by the pharmacist with each prescription and refill. Be sure to read this information carefully each time. Talk to your pediatrician regarding the use of this medicine in children. Special care may be needed. Overdosage: If you think you have taken too much of this medicine contact a poison control center or emergency room at once. NOTE: This medicine is only for you. Do not share this medicine with others. What if I miss a dose? If you miss a dose, take it as soon as you can. If it is almost time for your next dose, take only that dose. Do not take double or extra doses. What may interact with this medicine? Do not take this medicine with any of the following medications:  linezolid  MAOIs like Carbex, Eldepryl, Marplan, Nardil, and  Parnate  methylene blue (injected into a vein)  pimozide  thioridazine This medicine may also interact with the following medications:  alcohol  amphetamines  aspirin and aspirin-like medicines  atomoxetine  certain medicines for depression, anxiety, or psychotic disturbances  certain medicines for irregular heart beat like propafenone, flecainide, encainide, and quinidine  certain medicines for migraine headache like almotriptan, eletriptan, frovatriptan, naratriptan, rizatriptan, sumatriptan, zolmitriptan  cimetidine  digoxin  diuretics  fentanyl  fosamprenavir  furazolidone  isoniazid  lithium  medicines that treat or prevent blood clots like warfarin, enoxaparin, and dalteparin  medicines for sleep  NSAIDs, medicines for  pain and inflammation, like ibuprofen or naproxen  phenobarbital  phenytoin  procarbazine  rasagiline  ritonavir  supplements like St. John's wort, kava kava, valerian  tamoxifen  tramadol  tryptophan This list may not describe all possible interactions. Give your health care provider a list of all the medicines, herbs, non-prescription drugs, or dietary supplements you use. Also tell them if you smoke, drink alcohol, or use illegal drugs. Some items may interact with your medicine. What should I watch for while using this medicine? Tell your doctor if your symptoms do not get better or if they get worse. Visit your doctor or health care professional for regular checks on your progress. Because it may take several weeks to see the full effects of this medicine, it is important to continue your treatment as prescribed by your doctor. Patients and their families should watch out for new or worsening thoughts of suicide or depression. Also watch out for sudden changes in feelings such as feeling anxious, agitated, panicky, irritable, hostile, aggressive, impulsive, severely restless, overly excited and hyperactive, or not being able  to sleep. If this happens, especially at the beginning of treatment or after a change in dose, call your health care professional. Bonita Quin may get drowsy or dizzy. Do not drive, use machinery, or do anything that needs mental alertness until you know how this medicine affects you. Do not stand or sit up quickly, especially if you are an older patient. This reduces the risk of dizzy or fainting spells. Alcohol may interfere with the effect of this medicine. Avoid alcoholic drinks. Your mouth may get dry. Chewing sugarless gum or sucking hard candy, and drinking plenty of water will help. Contact your doctor if the problem does not go away or is severe. What side effects may I notice from receiving this medicine? Side effects that you should report to your doctor or health care professional as soon as possible:  allergic reactions like skin rash, itching or hives, swelling of the face, lips, or tongue  anxious  black, tarry stools  changes in vision  confusion  elevated mood, decreased need for sleep, racing thoughts, impulsive behavior  eye pain  fast, irregular heartbeat  feeling faint or lightheaded, falls  feeling agitated, angry, or irritable  hallucination, loss of contact with reality  loss of balance or coordination  loss of memory  painful or prolonged erections  restlessness, pacing, inability to keep still  seizures  stiff muscles  suicidal thoughts or other mood changes  trouble sleeping  unusual bleeding or bruising  unusually weak or tired  vomiting Side effects that usually do not require medical attention (report to your doctor or health care professional if they continue or are bothersome):  change in appetite or weight  change in sex drive or performance  diarrhea  dizziness  dry mouth  increased sweating  indigestion, nausea  tired  tremors This list may not describe all possible side effects. Call your doctor for medical advice about  side effects. You may report side effects to FDA at 1-800-FDA-1088. Where should I keep my medicine? Keep out of the reach of children. Store at room temperature between 15 and 30 degrees C (59 and 86 degrees F). Keep container tightly closed. Throw away any unused medicine after the expiration date. NOTE: This sheet is a summary. It may not cover all possible information. If you have questions about this medicine, talk to your doctor, pharmacist, or health care provider.  2020 Elsevier/Gold Standard (2015-05-25 15:50:32)  Buspirone tablets What is this medicine? BUSPIRONE (byoo SPYE rone) is used to treat anxiety disorders. This medicine may be used for other purposes; ask your health care provider or pharmacist if you have questions. COMMON BRAND NAME(S): BuSpar What should I tell my health care provider before I take this medicine? They need to know if you have any of these conditions:  kidney or liver disease  an unusual or allergic reaction to buspirone, other medicines, foods, dyes, or preservatives  pregnant or trying to get pregnant  breast-feeding How should I use this medicine? Take this medicine by mouth with a glass of water. Follow the directions on the prescription label. You may take this medicine with or without food. To ensure that this medicine always works the same way for you, you should take it either always with or always without food. Take your doses at regular intervals. Do not take your medicine more often than directed. Do not stop taking except on the advice of your doctor or health care professional. Talk to your pediatrician regarding the use of this medicine in children. Special care may be needed. Overdosage: If you think you have taken too much of this medicine contact a poison control center or emergency room at once. NOTE: This medicine is only for you. Do not share this medicine with others. What if I miss a dose? If you miss a dose, take it as soon  as you can. If it is almost time for your next dose, take only that dose. Do not take double or extra doses. What may interact with this medicine? Do not take this medicine with any of the following medications:  linezolid  MAOIs like Carbex, Eldepryl, Marplan, Nardil, and Parnate  methylene blue  procarbazine This medicine may also interact with the following medications:  diazepam  digoxin  diltiazem  erythromycin  grapefruit juice  haloperidol  medicines for mental depression or mood problems  medicines for seizures like carbamazepine, phenobarbital and phenytoin  nefazodone  other medications for anxiety  rifampin  ritonavir  some antifungal medicines like itraconazole, ketoconazole, and voriconazole  verapamil  warfarin This list may not describe all possible interactions. Give your health care provider a list of all the medicines, herbs, non-prescription drugs, or dietary supplements you use. Also tell them if you smoke, drink alcohol, or use illegal drugs. Some items may interact with your medicine. What should I watch for while using this medicine? Visit your doctor or health care professional for regular checks on your progress. It may take 1 to 2 weeks before your anxiety gets better. You may get drowsy or dizzy. Do not drive, use machinery, or do anything that needs mental alertness until you know how this drug affects you. Do not stand or sit up quickly, especially if you are an older patient. This reduces the risk of dizzy or fainting spells. Alcohol can make you more drowsy and dizzy. Avoid alcoholic drinks. What side effects may I notice from receiving this medicine? Side effects that you should report to your doctor or health care professional as soon as possible:  blurred vision or other vision changes  chest pain  confusion  difficulty breathing  feelings of hostility or anger  muscle aches and pains  numbness or tingling in hands or  feet  ringing in the ears  skin rash and itching  vomiting  weakness Side effects that usually do not require medical attention (report to your doctor or health care professional if they continue  or are bothersome):  disturbed dreams, nightmares  headache  nausea  restlessness or nervousness  sore throat and nasal congestion  stomach upset This list may not describe all possible side effects. Call your doctor for medical advice about side effects. You may report side effects to FDA at 1-800-FDA-1088. Where should I keep my medicine? Keep out of the reach of children. Store at room temperature below 30 degrees C (86 degrees F). Protect from light. Keep container tightly closed. Throw away any unused medicine after the expiration date. NOTE: This sheet is a summary. It may not cover all possible information. If you have questions about this medicine, talk to your doctor, pharmacist, or health care provider.  2020 Elsevier/Gold Standard (2009-08-01 18:06:11)   Bupropion tablets (Depression/Mood Disorders) What is this medicine? BUPROPION (byoo PROE pee on) is used to treat depression. This medicine may be used for other purposes; ask your health care provider or pharmacist if you have questions. COMMON BRAND NAME(S): Wellbutrin What should I tell my health care provider before I take this medicine? They need to know if you have any of these conditions:  an eating disorder, such as anorexia or bulimia  bipolar disorder or psychosis  diabetes or high blood sugar, treated with medication  glaucoma  heart disease, previous heart attack, or irregular heart beat  head injury or brain tumor  high blood pressure  kidney or liver disease  seizures  suicidal thoughts or a previous suicide attempt  Tourette's syndrome  weight loss  an unusual or allergic reaction to bupropion, other medicines, foods, dyes, or preservatives  breast-feeding  pregnant or trying to  become pregnant How should I use this medicine? Take this medicine by mouth with a glass of water. Follow the directions on the prescription label. You can take it with or without food. If it upsets your stomach, take it with food. Take your medicine at regular intervals. Do not take your medicine more often than directed. Do not stop taking this medicine suddenly except upon the advice of your doctor. Stopping this medicine too quickly may cause serious side effects or your condition may worsen. A special MedGuide will be given to you by the pharmacist with each prescription and refill. Be sure to read this information carefully each time. Talk to your pediatrician regarding the use of this medicine in children. Special care may be needed. Overdosage: If you think you have taken too much of this medicine contact a poison control center or emergency room at once. NOTE: This medicine is only for you. Do not share this medicine with others. What if I miss a dose? If you miss a dose, take it as soon as you can. If it is less than four hours to your next dose, take only that dose and skip the missed dose. Do not take double or extra doses. What may interact with this medicine? Do not take this medicine with any of the following medications:  linezolid  MAOIs like Azilect, Carbex, Eldepryl, Marplan, Nardil, and Parnate  methylene blue (injected into a vein)  other medicines that contain bupropion like Zyban This medicine may also interact with the following medications:  alcohol  certain medicines for anxiety or sleep  certain medicines for blood pressure like metoprolol, propranolol  certain medicines for depression or psychotic disturbances  certain medicines for HIV or AIDS like efavirenz, lopinavir, nelfinavir, ritonavir  certain medicines for irregular heart beat like propafenone, flecainide  certain medicines for Parkinson's disease like amantadine, levodopa  certain medicines for  seizures like carbamazepine, phenytoin, phenobarbital  cimetidine  clopidogrel  cyclophosphamide  digoxin  furazolidone  isoniazid  nicotine  orphenadrine  procarbazine  steroid medicines like prednisone or cortisone  stimulant medicines for attention disorders, weight loss, or to stay awake  tamoxifen  theophylline  thiotepa  ticlopidine  tramadol  warfarin This list may not describe all possible interactions. Give your health care provider a list of all the medicines, herbs, non-prescription drugs, or dietary supplements you use. Also tell them if you smoke, drink alcohol, or use illegal drugs. Some items may interact with your medicine. What should I watch for while using this medicine? Tell your doctor if your symptoms do not get better or if they get worse. Visit your doctor or healthcare provider for regular checks on your progress. Because it may take several weeks to see the full effects of this medicine, it is important to continue your treatment as prescribed by your doctor. This medicine may cause serious skin reactions. They can happen weeks to months after starting the medicine. Contact your healthcare provider right away if you notice fevers or flu-like symptoms with a rash. The rash may be red or purple and then turn into blisters or peeling of the skin. Or, you might notice a red rash with swelling of the face, lips or lymph nodes in your neck or under your arms. Patients and their families should watch out for new or worsening thoughts of suicide or depression. Also watch out for sudden changes in feelings such as feeling anxious, agitated, panicky, irritable, hostile, aggressive, impulsive, severely restless, overly excited and hyperactive, or not being able to sleep. If this happens, especially at the beginning of treatment or after a change in dose, call your healthcare provider. Avoid alcoholic drinks while taking this medicine. Drinking excessive  alcoholic beverages, using sleeping or anxiety medicines, or quickly stopping the use of these agents while taking this medicine may increase your risk for a seizure. Do not drive or use heavy machinery until you know how this medicine affects you. This medicine can impair your ability to perform these tasks. Do not take this medicine close to bedtime. It may prevent you from sleeping. Your mouth may get dry. Chewing sugarless gum or sucking hard candy, and drinking plenty of water may help. Contact your doctor if the problem does not go away or is severe. What side effects may I notice from receiving this medicine? Side effects that you should report to your doctor or health care professional as soon as possible:  allergic reactions like skin rash, itching or hives, swelling of the face, lips, or tongue  breathing problems  changes in vision  confusion  elevated mood, decreased need for sleep, racing thoughts, impulsive behavior  fast or irregular heartbeat  hallucinations, loss of contact with reality  increased blood pressure  rash, fever, and swollen lymph nodes  redness, blistering, peeling, or loosening of the skin, including inside the mouth  seizures  suicidal thoughts or other mood changes  unusually weak or tired  vomiting Side effects that usually do not require medical attention (report to your doctor or health care professional if they continue or are bothersome):  constipation  headache  loss of appetite  nausea  tremors  weight loss This list may not describe all possible side effects. Call your doctor for medical advice about side effects. You may report side effects to FDA at 1-800-FDA-1088. Where should I keep my medicine? Keep out of  the reach of children. Store at room temperature between 20 and 25 degrees C (68 and 77 degrees F), away from direct sunlight and moisture. Keep tightly closed. Throw away any unused medicine after the expiration  date. NOTE: This sheet is a summary. It may not cover all possible information. If you have questions about this medicine, talk to your doctor, pharmacist, or health care provider.  2020 Elsevier/Gold Standard (2018-03-17 14:02:47)

## 2019-03-30 ENCOUNTER — Other Ambulatory Visit (INDEPENDENT_AMBULATORY_CARE_PROVIDER_SITE_OTHER): Payer: Managed Care, Other (non HMO) | Admitting: Certified Nurse Midwife

## 2019-03-30 DIAGNOSIS — F419 Anxiety disorder, unspecified: Secondary | ICD-10-CM

## 2019-03-30 MED ORDER — ESCITALOPRAM OXALATE 10 MG PO TABS: 10.0000 mg | ORAL_TABLET | Freq: Every day | ORAL | 1 refills | Status: DC

## 2019-03-30 NOTE — Progress Notes (Signed)
Rx: Lexapro, see orders.    Gunnar Bulla, CNM [facility], [facility] 03/30/19 1:47 PM

## 2019-05-28 ENCOUNTER — Other Ambulatory Visit: Payer: Self-pay | Admitting: Certified Nurse Midwife

## 2019-05-28 DIAGNOSIS — F419 Anxiety disorder, unspecified: Secondary | ICD-10-CM

## 2019-05-29 ENCOUNTER — Other Ambulatory Visit (INDEPENDENT_AMBULATORY_CARE_PROVIDER_SITE_OTHER): Payer: Managed Care, Other (non HMO) | Admitting: Certified Nurse Midwife

## 2019-05-29 DIAGNOSIS — F419 Anxiety disorder, unspecified: Secondary | ICD-10-CM

## 2019-05-29 NOTE — Progress Notes (Signed)
Rx Lexapro, see orders.    Gunnar Bulla, CNM [facility], [facility] 05/29/19 8:52 AM

## 2019-08-31 ENCOUNTER — Encounter: Payer: Self-pay | Admitting: Certified Nurse Midwife

## 2019-08-31 ENCOUNTER — Other Ambulatory Visit: Payer: Self-pay

## 2019-08-31 ENCOUNTER — Ambulatory Visit (INDEPENDENT_AMBULATORY_CARE_PROVIDER_SITE_OTHER): Payer: Managed Care, Other (non HMO) | Admitting: Certified Nurse Midwife

## 2019-08-31 VITALS — BP 102/59 | HR 54 | Ht 60.0 in | Wt 139.0 lb

## 2019-08-31 DIAGNOSIS — Z1231 Encounter for screening mammogram for malignant neoplasm of breast: Secondary | ICD-10-CM

## 2019-08-31 DIAGNOSIS — Z01419 Encounter for gynecological examination (general) (routine) without abnormal findings: Secondary | ICD-10-CM

## 2019-08-31 DIAGNOSIS — Z008 Encounter for other general examination: Secondary | ICD-10-CM | POA: Diagnosis not present

## 2019-08-31 NOTE — Patient Instructions (Addendum)
Preventive Care 40-42 Years Old, Female Preventive care refers to visits with your health care provider and lifestyle choices that can promote health and wellness. This includes:  A yearly physical exam. This may also be called an annual well check.  Regular dental visits and eye exams.  Immunizations.  Screening for certain conditions.  Healthy lifestyle choices, such as eating a healthy diet, getting regular exercise, not using drugs or products that contain nicotine and tobacco, and limiting alcohol use. What can I expect for my preventive care visit? Physical exam Your health care provider will check your:  Height and weight. This may be used to calculate body mass index (BMI), which tells if you are at a healthy weight.  Heart rate and blood pressure.  Skin for abnormal spots. Counseling Your health care provider may ask you questions about your:  Alcohol, tobacco, and drug use.  Emotional well-being.  Home and relationship well-being.  Sexual activity.  Eating habits.  Work and work environment.  Method of birth control.  Menstrual cycle.  Pregnancy history. What immunizations do I need?  Influenza (flu) vaccine  This is recommended every year. Tetanus, diphtheria, and pertussis (Tdap) vaccine  You may need a Td booster every 10 years. Varicella (chickenpox) vaccine  You may need this if you have not been vaccinated. Zoster (shingles) vaccine  You may need this after age 60. Measles, mumps, and rubella (MMR) vaccine  You may need at least one dose of MMR if you were born in 1957 or later. You may also need a second dose. Pneumococcal conjugate (PCV13) vaccine  You may need this if you have certain conditions and were not previously vaccinated. Pneumococcal polysaccharide (PPSV23) vaccine  You may need one or two doses if you smoke cigarettes or if you have certain conditions. Meningococcal conjugate (MenACWY) vaccine  You may need this if you  have certain conditions. Hepatitis A vaccine  You may need this if you have certain conditions or if you travel or work in places where you may be exposed to hepatitis A. Hepatitis B vaccine  You may need this if you have certain conditions or if you travel or work in places where you may be exposed to hepatitis B. Haemophilus influenzae type b (Hib) vaccine  You may need this if you have certain conditions. Human papillomavirus (HPV) vaccine  If recommended by your health care provider, you may need three doses over 6 months. You may receive vaccines as individual doses or as more than one vaccine together in one shot (combination vaccines). Talk with your health care provider about the risks and benefits of combination vaccines. What tests do I need? Blood tests  Lipid and cholesterol levels. These may be checked every 5 years, or more frequently if you are over 50 years old.  Hepatitis C test.  Hepatitis B test. Screening  Lung cancer screening. You may have this screening every year starting at age 55 if you have a 30-pack-year history of smoking and currently smoke or have quit within the past 15 years.  Colorectal cancer screening. All adults should have this screening starting at age 50 and continuing until age 75. Your health care provider may recommend screening at age 45 if you are at increased risk. You will have tests every 1-10 years, depending on your results and the type of screening test.  Diabetes screening. This is done by checking your blood sugar (glucose) after you have not eaten for a while (fasting). You may have this   done every 1-3 years.  Mammogram. This may be done every 1-2 years. Talk with your health care provider about when you should start having regular mammograms. This may depend on whether you have a family history of breast cancer.  BRCA-related cancer screening. This may be done if you have a family history of breast, ovarian, tubal, or peritoneal  cancers.  Pelvic exam and Pap test. This may be done every 3 years starting at age 83. Starting at age 75, this may be done every 5 years if you have a Pap test in combination with an HPV test. Other tests  Sexually transmitted disease (STD) testing.  Bone density scan. This is done to screen for osteoporosis. You may have this scan if you are at high risk for osteoporosis. Follow these instructions at home: Eating and drinking  Eat a diet that includes fresh fruits and vegetables, whole grains, lean protein, and low-fat dairy.  Take vitamin and mineral supplements as recommended by your health care provider.  Do not drink alcohol if: ? Your health care provider tells you not to drink. ? You are pregnant, may be pregnant, or are planning to become pregnant.  If you drink alcohol: ? Limit how much you have to 0-1 drink a day. ? Be aware of how much alcohol is in your drink. In the [place] one drink equals one 12 oz bottle of beer (355 mL), one 5 oz glass of wine (148 mL), or one 1 oz glass of hard liquor (44 mL). Lifestyle  Take daily care of your teeth and gums.  Stay active. Exercise for at least 30 minutes on 5 or more days each week.  Do not use any products that contain nicotine or tobacco, such as cigarettes, e-cigarettes, and chewing tobacco. If you need help quitting, ask your health care provider.  If you are sexually active, practice safe sex. Use a condom or other form of birth control (contraception) in order to prevent pregnancy and STIs (sexually transmitted infections).  If told by your health care provider, take low-dose aspirin daily starting at age 84. What's next?  Visit your health care provider once a year for a well check visit.  Ask your health care provider how often you should have your eyes and teeth checked.  Stay up to date on all vaccines. This information is not intended to replace advice given to you by your health care provider. Make sure you  discuss any questions you have with your health care provider. Document Revised: 09/02/2017 Document Reviewed: 09/02/2017 Elsevier Patient Education  2020 [employer].  Levonorgestrel intrauterine device (IUD) What is this medicine? LEVONORGESTREL IUD (LEE voe nor jes trel) is a contraceptive (birth control) device. The device is placed inside the uterus by a healthcare professional. It is used to prevent pregnancy. This device can also be used to treat heavy bleeding that occurs during your period. This medicine may be used for other purposes; ask your health care provider or pharmacist if you have questions. COMMON BRAND NAME(S): Cameron Ali What should I tell my health care provider before I take this medicine? They need to know if you have any of these conditions:  abnormal Pap smear  cancer of the breast, uterus, or cervix  diabetes  endometritis  genital or pelvic infection now or in the past  have more than one sexual partner or your partner has more than one partner  heart disease  history of an ectopic or tubal pregnancy  immune  system problems  IUD in place  liver disease or tumor  problems with blood clots or take blood-thinners  seizures  use intravenous drugs  uterus of unusual shape  vaginal bleeding that has not been explained  an unusual or allergic reaction to levonorgestrel, other hormones, silicone, or polyethylene, medicines, foods, dyes, or preservatives  pregnant or trying to get pregnant  breast-feeding How should I use this medicine? This device is placed inside the uterus by a health care professional. Talk to your pediatrician regarding the use of this medicine in children. Special care may be needed. Overdosage: If you think you have taken too much of this medicine contact a poison control center or emergency room at once. NOTE: This medicine is only for you. Do not share this medicine with others. What if I miss a  dose? This does not apply. Depending on the brand of device you have inserted, the device will need to be replaced every 3 to 6 years if you wish to continue using this type of birth control. What may interact with this medicine? Do not take this medicine with any of the following medications:  amprenavir  bosentan  fosamprenavir This medicine may also interact with the following medications:  aprepitant  armodafinil  barbiturate medicines for inducing sleep or treating seizures  bexarotene  boceprevir  griseofulvin  medicines to treat seizures like carbamazepine, ethotoin, felbamate, oxcarbazepine, phenytoin, topiramate  modafinil  pioglitazone  rifabutin  rifampin  rifapentine  some medicines to treat HIV infection like atazanavir, efavirenz, indinavir, lopinavir, nelfinavir, tipranavir, ritonavir  St. John's wort  warfarin This list may not describe all possible interactions. Give your health care provider a list of all the medicines, herbs, non-prescription drugs, or dietary supplements you use. Also tell them if you smoke, drink alcohol, or use illegal drugs. Some items may interact with your medicine. What should I watch for while using this medicine? Visit your doctor or health care professional for regular check ups. See your doctor if you or your partner has sexual contact with others, becomes HIV positive, or gets a sexual transmitted disease. This product does not protect you against HIV infection (AIDS) or other sexually transmitted diseases. You can check the placement of the IUD yourself by reaching up to the top of your vagina with clean fingers to feel the threads. Do not pull on the threads. It is a good habit to check placement after each menstrual period. Call your doctor right away if you feel more of the IUD than just the threads or if you cannot feel the threads at all. The IUD may come out by itself. You may become pregnant if the device comes out.  If you notice that the IUD has come out use a backup birth control method like condoms and call your health care provider. Using tampons will not change the position of the IUD and are okay to use during your period. This IUD can be safely scanned with magnetic resonance imaging (MRI) only under specific conditions. Before you have an MRI, tell your healthcare provider that you have an IUD in place, and which type of IUD you have in place. What side effects may I notice from receiving this medicine? Side effects that you should report to your doctor or health care professional as soon as possible:  allergic reactions like skin rash, itching or hives, swelling of the face, lips, or tongue  fever, flu-like symptoms  genital sores  high blood pressure  no menstrual period  for 6 weeks during use  pain, swelling, warmth in the leg  pelvic pain or tenderness  severe or sudden headache  signs of pregnancy  stomach cramping  sudden shortness of breath  trouble with balance, talking, or walking  unusual vaginal bleeding, discharge  yellowing of the eyes or skin Side effects that usually do not require medical attention (report to your doctor or health care professional if they continue or are bothersome):  acne  breast pain  change in sex drive or performance  changes in weight  cramping, dizziness, or faintness while the device is being inserted  headache  irregular menstrual bleeding within first 3 to 6 months of use  nausea This list may not describe all possible side effects. Call your doctor for medical advice about side effects. You may report side effects to FDA at 1-800-FDA-1088. Where should I keep my medicine? This does not apply. NOTE: This sheet is a summary. It may not cover all possible information. If you have questions about this medicine, talk to your doctor, pharmacist, or health care provider.  2020 Elsevier/Gold Standard (2017-11-02 13:22:01)

## 2019-08-31 NOTE — Progress Notes (Signed)
ANNUAL PREVENTATIVE CARE GYN  ENCOUNTER NOTE  Subjective:       Natalie Davis is a 44 y.o. G23P2002 female here for a routine annual gynecologic exam.  Current complaints: 1. Questions IUD insertion for management of perimenopausal symptoms like menorrhagia 2. Needs labs for biometric screening at work  Denies difficulty breathing or respiratory distress, chest pain, abdominal pain, excessive vaginal bleeding, dysuria, and leg pain or swelling.    Gynecologic History  Patient's last menstrual period was 08/17/2019 (approximate).  Contraception: vasectomy   Upstream - 08/31/19 1431      Pregnancy Intention Screening   Does the patient want to become pregnant in the next year? No    Does the patient's partner want to become pregnant in the next year? No    Would the patient like to discuss contraceptive options today? No      Contraception Wrap Up   Current Method Vasectomy    Contraception Counseling Provided No          The pregnancy intention screening data noted above was reviewed. Potential methods of contraception were discussed. The patient elected to proceed with Vasectomy.   Last Pap: 03/2016. Results were: Neg/Neg  Last mammogram: 11/2018. Results were: BI-RADS 3  Obstetric History  OB History  Gravida Para Term Preterm AB Living  2 2 2     2   SAB TAB Ectopic Multiple Live Births          2    # Outcome Date GA Lbr Len/2nd Weight Sex Delivery Anes PTL Lv  2 Term 05/10/08   8 lb 1 oz (3.657 kg) F Vag-Spont  N LIV  1 Term 10/04/06   6 lb 4 oz (2.835 kg) F Vag-Spont  N LIV    No past medical history on file.  Past Surgical History:  Procedure Laterality Date  . APPENDECTOMY      Current Outpatient Medications on File Prior to Visit  Medication Sig Dispense Refill  . Vitamin D, Ergocalciferol, (DRISDOL) 1.25 MG (50000 UT) CAPS capsule Take 1 capsule (50,000 Units total) by mouth 2 (two) times a week. 30 capsule 1   No current facility-administered  medications on file prior to visit.    No Known Allergies  Social History   Socioeconomic History  . Marital status: Married    Spouse name: Not on file  . Number of children: Not on file  . Years of education: Not on file  . Highest education level: Not on file  Occupational History  . Not on file  Tobacco Use  . Smoking status: Never Smoker  . Smokeless tobacco: Never Used  Vaping Use  . Vaping Use: Never used  Substance and Sexual Activity  . Alcohol use: Yes    Alcohol/week: 0.0 standard drinks    Comment: occas  . Drug use: No  . Sexual activity: Yes    Comment: husband vasectomy  Other Topics Concern  . Not on file  Social History Narrative  . Not on file   Social Determinants of Health   Financial Resource Strain:   . Difficulty of Paying Living Expenses: Not on file  Food Insecurity:   . Worried About 10/06/06 in the Last Year: Not on file  . Ran Out of Food in the Last Year: Not on file  Transportation Needs:   . Lack of Transportation (Medical): Not on file  . Lack of Transportation (Non-Medical): Not on file  Physical Activity:   . Days  of Exercise per Week: Not on file  . Minutes of Exercise per Session: Not on file  Stress:   . Feeling of Stress : Not on file  Social Connections:   . Frequency of Communication with Friends and Family: Not on file  . Frequency of Social Gatherings with Friends and Family: Not on file  . Attends Religious Services: Not on file  . Active Member of Clubs or Organizations: Not on file  . Attends Banker Meetings: Not on file  . Marital Status: Not on file  Intimate Partner Violence:   . Fear of Current or Ex-Partner: Not on file  . Emotionally Abused: Not on file  . Physically Abused: Not on file  . Sexually Abused: Not on file    Family History  Problem Relation Age of Onset  . Emphysema Paternal Grandfather   . Breast cancer Maternal Grandmother     The following portions of the  patient's history were reviewed and updated as appropriate: allergies, current medications, past family history, past medical history, past social history, past surgical history and problem list.  Review of Systems  ROS negative except as noted above. Information obtained from patient.    Objective:   BP (!) 102/59   Pulse (!) 54   Ht 5' (1.524 m)   Wt 139 lb (63 kg)   LMP 08/17/2019 (Approximate)   BMI 27.15 kg/m    CONSTITUTIONAL: Well-developed, well-nourished female in no acute  distress.   PSYCHIATRIC: Normal mood and affect. Normal behavior. Normal judgment and thought content.  NEUROLGIC: Alert and oriented to person, place, and time. Normal muscle tone coordination. No cranial nerve deficit noted.  HENT:  Normocephalic, atraumatic, External right and left ear normal.   EYES: Conjunctivae and EOM are normal. Pupils are equal and round.   NECK: Normal range of motion, supple, no masses.  Normal thyroid.   SKIN: Skin is warm and dry. No rash noted. Not diaphoretic. No erythema. No pallor.  CARDIOVASCULAR: Normal heart rate noted, regular rhythm, no murmur.  RESPIRATORY: Clear to auscultation bilaterally. Effort and breath sounds normal, no problems with respiration noted.  BREASTS: Symmetric in size. No masses, skin changes, nipple drainage, or lymphadenopathy.  ABDOMEN: Soft, normal bowel sounds, no distention noted.  No tenderness, rebound or guarding.   PELVIC:  External Genitalia: Normal  Vagina: Normal  Cervix: Normal  Uterus: Normal  Adnexa: Normal  MUSCULOSKELETAL: Normal range of motion. No tenderness.  No cyanosis, clubbing, or edema.  2+ distal pulses.  LYMPHATIC: No Axillary, Supraclavicular, or Inguinal Adenopathy.  Assessment:   Annual gynecologic examination 44 y.o.   Contraception: vasectomy   Overweight   Problem List Items Addressed This Visit    None    Visit Diagnoses    Encounter for gynecological examination without abnormal  finding    -  Primary   Anxiety          Plan:   Pap: Not needed  Mammogram: Ordered  Labs: Lipid 1 and FBS, see orders   Routine preventative health maintenance measures emphasized: Exercise/Diet/Weight control, Tobacco Warnings, Alcohol/Substance use risks and Stress Management; see AVS  Discussed use of Mirena for management of menorrhagia and other perimenopause symptoms  Return to Clinic - 1 Year for [employer] [employer] and Pap or sooner if needed   Serafina Royals, CNM  [facility], [facility] 08/31/19 2:35 PM

## 2019-09-06 ENCOUNTER — Other Ambulatory Visit: Payer: Managed Care, Other (non HMO)

## 2019-09-06 ENCOUNTER — Other Ambulatory Visit: Payer: Self-pay

## 2019-09-06 DIAGNOSIS — Z008 Encounter for other general examination: Secondary | ICD-10-CM

## 2019-09-07 LAB — LIPID PANEL
Chol/HDL Ratio: 2.8 ratio (ref 0.0–4.4)
Cholesterol, Total: 198 mg/dL (ref 100–199)
HDL: 70 mg/dL (ref >39)
LDL Chol Calc (NIH): 107 mg/dL — ABNORMAL HIGH (ref 0–99)
Triglycerides: 118 mg/dL (ref 0–149)
VLDL Cholesterol Cal: 21 mg/dL (ref 5–40)

## 2019-09-07 LAB — GLUCOSE, RANDOM: Glucose: 93 mg/dL (ref 65–99)

## 2020-01-05 ENCOUNTER — Other Ambulatory Visit: Payer: Self-pay | Admitting: Certified Nurse Midwife

## 2020-01-05 DIAGNOSIS — F419 Anxiety disorder, unspecified: Secondary | ICD-10-CM

## 2020-01-05 MED ORDER — BUSPIRONE HCL 5 MG PO TABS: 5.0000 mg | ORAL_TABLET | Freq: Three times a day (TID) | ORAL | 1 refills | Status: DC

## 2020-01-05 NOTE — Progress Notes (Signed)
Rx Buspar, see orders    Serafina Royals, CNM [facility], [facility] 01/05/20 12:38 PM

## 2020-03-05 ENCOUNTER — Other Ambulatory Visit: Payer: Self-pay | Admitting: Certified Nurse Midwife

## 2020-03-31 ENCOUNTER — Other Ambulatory Visit: Payer: Self-pay | Admitting: Certified Nurse Midwife

## 2020-05-08 ENCOUNTER — Other Ambulatory Visit: Payer: Self-pay | Admitting: Certified Nurse Midwife

## 2020-06-19 ENCOUNTER — Other Ambulatory Visit: Payer: Self-pay | Admitting: Certified Nurse Midwife

## 2021-01-28 ENCOUNTER — Encounter: Payer: Self-pay | Admitting: Certified Nurse Midwife

## 2021-01-28 ENCOUNTER — Other Ambulatory Visit (HOSPITAL_COMMUNITY)
Admission: RE | Admit: 2021-01-28 | Discharge: 2021-01-28 | Disposition: A | Discharge status: Home / Self Care | Payer: Managed Care, Other (non HMO) | Source: Ambulatory Visit | Attending: Certified Nurse Midwife | Admitting: Certified Nurse Midwife

## 2021-01-28 ENCOUNTER — Other Ambulatory Visit: Payer: Self-pay

## 2021-01-28 ENCOUNTER — Ambulatory Visit (INDEPENDENT_AMBULATORY_CARE_PROVIDER_SITE_OTHER): Payer: Managed Care, Other (non HMO) | Admitting: Certified Nurse Midwife

## 2021-01-28 VITALS — BP 93/57 | HR 82 | Ht 60.0 in | Wt 131.5 lb

## 2021-01-28 DIAGNOSIS — Z124 Encounter for screening for malignant neoplasm of cervix: Secondary | ICD-10-CM | POA: Insufficient documentation

## 2021-01-28 DIAGNOSIS — Z01419 Encounter for gynecological examination (general) (routine) without abnormal findings: Secondary | ICD-10-CM | POA: Diagnosis present

## 2021-01-28 DIAGNOSIS — Z1231 Encounter for screening mammogram for malignant neoplasm of breast: Secondary | ICD-10-CM | POA: Diagnosis not present

## 2021-01-28 NOTE — Progress Notes (Signed)
GYNECOLOGY ANNUAL PREVENTATIVE CARE ENCOUNTER NOTE  History:     Natalie Davis is a 46 y.o. G54P2002 female here for a routine annual gynecologic exam.  Current complaints: none.   Denies abnormal vaginal bleeding, discharge, pelvic pain, problems with intercourse or other gynecologic concerns.     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   Gynecologic History Patient's last menstrual period was 01/01/2021 (exact date). Contraception: vasectomy Last Pap: 03/24/2016. Results were: normal with negative HPV Last mammogram: 2020. Results were: normal  Obstetric History OB History  Gravida Para Term Preterm AB Living  2 2 2     2   SAB IAB Ectopic Multiple Live Births          2    # Outcome Date GA Lbr Len/2nd Weight Sex Delivery Anes PTL Lv  2 Term 05/10/08   8 lb 1 oz (3.657 kg) F Vag-Spont  N LIV  1 Term 10/04/06   6 lb 4 oz (2.835 kg) F Vag-Spont  N LIV    History reviewed. No pertinent past medical history.  Past Surgical History:  Procedure Laterality Date   APPENDECTOMY      Current Outpatient Medications on File Prior to Visit  Medication Sig Dispense Refill   busPIRone (BUSPAR) 5 MG tablet TAKE 1 TABLET BY MOUTH THREE TIMES A DAY 90 tablet 0   Vitamin D, Ergocalciferol, (DRISDOL) 1.25 MG (50000 UT) CAPS capsule Take 1 capsule (50,000 Units total) by mouth 2 (two) times a week. 30 capsule 1   No current facility-administered medications on file prior to visit.    No Known Allergies  Social History:  reports that she has never smoked. She has never used smokeless tobacco. She reports current alcohol use. She reports that she does not use drugs.  Family History  Problem Relation Age of Onset   Emphysema Paternal Grandfather    Breast cancer Maternal Grandmother     The following portions of the patient's history were reviewed and  updated as appropriate: allergies, current medications, past family history, past medical history, past social history, past surgical history and problem list.  Review of Systems Pertinent items noted in HPI and remainder of comprehensive ROS otherwise negative.  Physical Exam:  BP (!) 93/57    Pulse 82    Ht 5' (1.524 m)    Wt 131 lb 8 oz (59.6 kg)    LMP 01/01/2021 (Exact Date)    BMI 25.68 kg/m  CONSTITUTIONAL: Well-developed, well-nourished female in no acute distress.  HENT:  Normocephalic, atraumatic, External right and left ear normal. Oropharynx is clear and moist EYES: Conjunctivae and EOM are normal. Pupils are equal, round, and reactive to light. No scleral icterus.  NECK: Normal range of motion, supple, no masses.  Normal thyroid.  SKIN: Skin is warm and dry. No rash noted. Not diaphoretic. No erythema. No pallor. MUSCULOSKELETAL: Normal range of motion. No tenderness.  No cyanosis, clubbing, or edema.  2+ distal pulses. NEUROLOGIC: Alert and oriented to person, place, and time. Normal reflexes, muscle tone coordination.  PSYCHIATRIC: Normal mood and affect. Normal behavior. Normal judgment and thought content. CARDIOVASCULAR: Normal heart rate noted, regular rhythm RESPIRATORY: Clear to auscultation bilaterally. Effort and breath sounds normal, no problems with respiration noted. BREASTS: Symmetric in size. No masses, tenderness, skin changes, nipple drainage, or lymphadenopathy bilaterally.  ABDOMEN: Soft, no distention noted.  No tenderness, rebound or  guarding.  PELVIC: Normal appearing external genitalia and urethral meatus; normal appearing vaginal mucosa and cervix.  No abnormal discharge noted.  Pap smear obtained. Contact bleeding. Nabothian cyst 9 o clock. Normal uterine size, no other palpable masses, no uterine or adnexal tenderness.  .   Assessment and Plan:    1. Well woman exam with routine gynecological exam  Pap: Will follow up results of pap smear and manage  accordingly. Mammogram : ordered Labs: declines  Refills: none Referral: none Routine preventative health maintenance measures emphasized. Please refer to After Visit Summary for other counseling recommendations.      Doreene Burke, CNM [facility],  [facility]

## 2021-01-30 ENCOUNTER — Other Ambulatory Visit: Payer: Self-pay | Admitting: Obstetrics

## 2021-01-30 ENCOUNTER — Encounter: Payer: Self-pay | Admitting: Obstetrics

## 2021-01-30 LAB — CYTOLOGY - PAP
Comment: NEGATIVE
Diagnosis: NEGATIVE
High risk HPV: NEGATIVE

## 2021-01-30 MED ORDER — FLUCONAZOLE 150 MG PO TABS: 150.0000 mg | ORAL_TABLET | Freq: Once | ORAL | 0 refills | Status: AC

## 2021-02-07 ENCOUNTER — Other Ambulatory Visit: Payer: Self-pay | Admitting: Certified Nurse Midwife

## 2021-02-07 DIAGNOSIS — R928 Other abnormal and inconclusive findings on diagnostic imaging of breast: Secondary | ICD-10-CM

## 2021-02-19 ENCOUNTER — Ambulatory Visit
Admission: RE | Admit: 2021-02-19 | Discharge: 2021-02-19 | Disposition: A | Discharge status: Home / Self Care | Payer: Managed Care, Other (non HMO) | Source: Ambulatory Visit | Attending: Certified Nurse Midwife | Admitting: Certified Nurse Midwife

## 2021-02-19 ENCOUNTER — Other Ambulatory Visit: Payer: Self-pay

## 2021-02-19 DIAGNOSIS — R928 Other abnormal and inconclusive findings on diagnostic imaging of breast: Secondary | ICD-10-CM | POA: Insufficient documentation

## 2021-04-28 ENCOUNTER — Encounter: Payer: Self-pay | Admitting: Certified Nurse Midwife

## 2021-05-22 ENCOUNTER — Encounter: Payer: Self-pay | Admitting: Certified Nurse Midwife

## 2021-05-22 ENCOUNTER — Ambulatory Visit: Payer: Managed Care, Other (non HMO) | Admitting: Certified Nurse Midwife

## 2021-05-22 VITALS — BP 91/55 | HR 67 | Ht 60.0 in | Wt 134.8 lb

## 2021-05-22 DIAGNOSIS — Z79899 Other long term (current) drug therapy: Secondary | ICD-10-CM | POA: Diagnosis not present

## 2021-05-22 MED ORDER — BUSPIRONE HCL 5 MG PO TABS: 5.0000 mg | ORAL_TABLET | Freq: Two times a day (BID) | ORAL | 1 refills | Status: DC

## 2021-05-22 NOTE — Patient Instructions (Signed)

## 2021-05-22 NOTE — Progress Notes (Signed)
  Medication Management Clinic Visit Note  Patient: Natalie Davis MRN: [id number] Date of Birth: Jul 16, 1975 PCP: Oneita Hurt No   Seward Speck 46 y.o. female presents for a refill on her buspar. Pt states she had stopped taking . She states she was doing well and did not feel like she needed any more but after being off of it noticed that her symptoms returned. She feels like her mind does not shut off. She has trouble enjoying time doing relaxing things because she is always thinking about the next task.   BP (!) 91/55   Pulse 67   Ht 5' (1.524 m)   Wt 134 lb 12.8 oz (61.1 kg)   BMI 26.33 kg/m   Patient Information  History reviewed. No pertinent past medical history.    Past Surgical History:  Procedure Laterality Date   APPENDECTOMY       Family History  Problem Relation Age of Onset   Emphysema Paternal Grandfather    Breast cancer Maternal Grandmother      Social History   Substance and Sexual Activity  Alcohol Use Yes   Alcohol/week: 0.0 standard drinks   Comment: occas   Social History   Tobacco Use  Smoking Status Never  Smokeless Tobacco Never   Health Maintenance  Topic Date Due   COVID-19 Vaccine (1) Never done   INFLUENZA VACCINE  08/05/2021   PAP SMEAR-Modifier  01/29/2024   TETANUS/TDAP  07/15/2025   HIV Screening  Completed   HPV VACCINES  Aged Out   Hepatitis C Screening  Discontinued     Assessment and Plan:  Gad 14 Phq9 11  States that the buspar worked well for her but she had a hard time taking it 3 times a day. Discussed changing dosing to twice a day. She agrees. She will follow up in 4 wks .   Doreene Burke, CNM

## 2021-06-11 ENCOUNTER — Telehealth (INDEPENDENT_AMBULATORY_CARE_PROVIDER_SITE_OTHER): Payer: Managed Care, Other (non HMO) | Admitting: Certified Nurse Midwife

## 2021-06-11 ENCOUNTER — Encounter: Payer: Self-pay | Admitting: Certified Nurse Midwife

## 2021-06-11 DIAGNOSIS — Z79899 Other long term (current) drug therapy: Secondary | ICD-10-CM | POA: Diagnosis not present

## 2021-06-11 DIAGNOSIS — F419 Anxiety disorder, unspecified: Secondary | ICD-10-CM | POA: Diagnosis not present

## 2021-06-11 MED ORDER — BUSPIRONE HCL 5 MG PO TABS: 5.0000 mg | ORAL_TABLET | Freq: Two times a day (BID) | ORAL | 2 refills | Status: DC

## 2021-06-11 NOTE — Progress Notes (Signed)
Virtual Visit via Video Note  I connected with Natalie Davis on 06/11/21 at  2:00 PM EDT by a video enabled telemedicine application and verified that I am speaking with the correct person using two identifiers.  Location: Patient: at home Provider: at office    I discussed the limitations of evaluation and management by telemedicine and the availability of in person appointments. The patient expressed understanding and agreed to proceed.  History of Present Illness: Pt re started her Natalie Davis , state she had come off of it because she felt like she did not need it any longer. After being off of it she realized that she did need it. She was having difficulty with her mind racing and not being able to shut if off.  We changed it BID dose for convince as she sometimes missed the morning dose. She states she is feeling better. She notes that she has some occasional bad days but feels like it will continue to get better as her kids are getting out of school soon.  She declines changing the dose at this time.    Observations/Objective: Doing much better   Assessment and Plan: Continue current dose. Discussed increasing am or pm dose if needed. She will let me know if she decides to do this.      06/11/2021    2:27 PM 05/22/2021    3:39 PM 03/27/2019    4:18 PM  GAD 7 : Generalized Anxiety Score  Nervous, Anxious, on Edge 1 2 2   Control/stop worrying 1 2 2   Worry too much - different things 1 2 2   Trouble relaxing 1 2 3   Restless 1 2 3   Easily annoyed or irritable 1 3 2   Afraid - awful might happen 0 1 1  Total GAD 7 Score 6 14 15   Anxiety Difficulty Somewhat difficult Somewhat difficult Somewhat difficult      06/11/2021    2:27 PM 05/22/2021    3:38 PM  PHQ9 SCORE ONLY  PHQ-9 Total Score 8 11      Follow Up Instructions: In my chart should she change dose or if she continues current dose will refill x 2 months today.    I discussed the assessment and treatment plan with the  patient. The patient was provided an opportunity to ask questions and all were answered. The patient agreed with the plan and demonstrated an understanding of the instructions.   The patient was advised to call back or seek an in-person evaluation if the symptoms worsen or if the condition fails to improve as anticipated.  I provided 12 minutes of non-face-to-face time during this encounter.   , CNM

## 2021-06-30 ENCOUNTER — Other Ambulatory Visit: Payer: Self-pay | Admitting: Certified Nurse Midwife

## 2021-06-30 MED ORDER — BUSPIRONE HCL 5 MG PO TABS: 5.0000 mg | ORAL_TABLET | Freq: Two times a day (BID) | ORAL | 5 refills | Status: DC

## 2021-09-23 ENCOUNTER — Ambulatory Visit: Payer: Managed Care, Other (non HMO) | Admitting: Certified Nurse Midwife

## 2021-09-23 ENCOUNTER — Encounter: Payer: Self-pay | Admitting: Certified Nurse Midwife

## 2021-09-23 VITALS — BP 116/69 | HR 85 | Ht 60.0 in | Wt 139.0 lb

## 2021-09-23 DIAGNOSIS — R4586 Emotional lability: Secondary | ICD-10-CM

## 2021-09-23 DIAGNOSIS — N951 Menopausal and female climacteric states: Secondary | ICD-10-CM

## 2021-09-23 DIAGNOSIS — R5383 Other fatigue: Secondary | ICD-10-CM

## 2021-09-23 DIAGNOSIS — R61 Generalized hyperhidrosis: Secondary | ICD-10-CM | POA: Diagnosis not present

## 2021-09-23 NOTE — Progress Notes (Signed)
GYN ENCOUNTER NOTE  Subjective:       Natalie Davis is a 46 y.o. [id number] female is here for gynecologic evaluation of the following issues:  1. Peri menopausal changes Mood swings Night sweats fatigue.     Gynecologic History Patient's last menstrual period was 09/07/2021. Contraception: vasectomy Last Pap: 01/28/2021. Results were: normal/HPV neg.  Last mammogram: 02/19/2021. Results were: normal  Obstetric History OB History  Gravida Para Term Preterm AB Living  2 2 2     2   SAB IAB Ectopic Multiple Live Births          2    # Outcome Date GA Lbr Len/2nd Weight Sex Delivery Anes PTL Lv  2 Term 05/10/08   8 lb 1 oz (3.657 kg) F Vag-Spont  N LIV  1 Term 10/04/06   6 lb 4 oz (2.835 kg) F Vag-Spont  N LIV    No past medical history on file.  Past Surgical History:  Procedure Laterality Date   APPENDECTOMY      Current Outpatient Medications on File Prior to Visit  Medication Sig Dispense Refill   busPIRone (BUSPAR) 5 MG tablet Take 1 tablet (5 mg total) by mouth 2 (two) times daily. 60 tablet 5   No current facility-administered medications on file prior to visit.    No Known Allergies  Social History   Socioeconomic History   Marital status: Married    Spouse name: Not on file   Number of children: Not on file   Years of education: Not on file   Highest education level: Not on file  Occupational History   Not on file  Tobacco Use   Smoking status: Never   Smokeless tobacco: Never  Vaping Use   Vaping Use: Never used  Substance and Sexual Activity   Alcohol use: Yes    Alcohol/week: 0.0 standard drinks of alcohol    Comment: occas   Drug use: No   Sexual activity: Yes    Comment: husband vasectomy  Other Topics Concern   Not on file  Social History Narrative   Not on file   Social Determinants of Health   Financial Resource Strain: Not on file  Food Insecurity: Not on file  Transportation Needs: Not on file  Physical Activity: Not on file   Stress: Not on file  Social Connections: Not on file  Intimate Partner Violence: Not on file    Family History  Problem Relation Age of Onset   Emphysema Paternal Grandfather    Breast cancer Maternal Grandmother     The following portions of the patient's history were reviewed and updated as appropriate: allergies, current medications, past family history, past medical history, past social history, past surgical history and problem list.  Review of Systems Review of Systems - Negative except as mentioned in HPI Review of Systems - General ROS: negative for - chills, fatigue, fever, hot flashes, malaise or night sweats Hematological and Lymphatic ROS: negative for - bleeding problems or swollen lymph nodes Gastrointestinal ROS: negative for - abdominal pain, blood in stools, change in bowel habits and nausea/vomiting Musculoskeletal ROS: negative for - joint pain, muscle pain or muscular weakness Genito-Urinary ROS: negative for - change in menstrual cycle, dysmenorrhea, dyspareunia, dysuria, genital discharge, genital ulcers, hematuria, incontinence, irregular/heavy menses, nocturia or pelvic painjj  Objective:   BP 116/69   Pulse 85   Ht 5' (1.524 m)   Wt 139 lb (63 kg)   LMP 09/07/2021   BMI 27.15  kg/m  CONSTITUTIONAL: Well-developed, well-nourished female in no acute distress.  HENT:  Normocephalic, atraumatic.  NECK: Normal range of motion, supple, no masses.  Normal thyroid.  SKIN: Skin is warm and dry. No rash noted. Not diaphoretic. No erythema. No pallor. [place]: Alert and oriented to person, place, and time. PSYCHIATRIC: Normal mood and affect. Normal behavior. Normal judgment and thought content. CARDIOVASCULAR:Not Examined RESPIRATORY: Not Examined BREASTS: Not Examined ABDOMEN: Soft, non distended; Non tender.  No Organomegaly. PELVIC:not indicated  MUSCULOSKELETAL: Normal range of motion. No tenderness.  No cyanosis, clubbing, or edema.     Assessment:    Mood swings Night sweats Fatigue   Plan:   Discussed normal perimenopausal changes, Reassurance given. Discussed self help measures ie diet/exercise, soy, dressing in layers, reviewed over the counter medications to help with sleep, and reviewed prescription medication option. She is considering IUD to help control her cycle, as well as control other symptoms. Discussed that this is a progestin only method and that it may or may not help her symptoms as she is hoping. She verbalizes and agrees to plan. She will follow up prn for IUD placement.   Face to face time 15 min.  Philip Aspen, CNM

## 2021-12-16 ENCOUNTER — Telehealth: Payer: Self-pay | Admitting: Certified Nurse Midwife

## 2021-12-16 NOTE — Telephone Encounter (Signed)
Reached out to pt about 02/02/22 appt with Doreene Burke.  Wanting to reschedule the appt to 10:55 instead of 11:00.  Left message for pt to call back to confirm change of appt.

## 2021-12-17 NOTE — Telephone Encounter (Signed)
I contacted patient via phone, left voicemail for patient to call back to confirm new time for scheduled appointment.

## 2021-12-31 NOTE — Telephone Encounter (Signed)
Pt is scheduled with ABC on 02/02/22 at 2:15.

## 2022-01-12 ENCOUNTER — Other Ambulatory Visit: Payer: Self-pay | Admitting: Certified Nurse Midwife

## 2022-01-12 DIAGNOSIS — Z1231 Encounter for screening mammogram for malignant neoplasm of breast: Secondary | ICD-10-CM

## 2022-01-30 ENCOUNTER — Encounter: Payer: Self-pay | Admitting: Certified Nurse Midwife

## 2022-01-30 ENCOUNTER — Ambulatory Visit: Payer: Managed Care, Other (non HMO) | Admitting: Certified Nurse Midwife

## 2022-01-30 VITALS — BP 115/77 | HR 71 | Wt 144.8 lb

## 2022-01-30 DIAGNOSIS — Z975 Presence of (intrauterine) contraceptive device: Secondary | ICD-10-CM

## 2022-01-30 DIAGNOSIS — Z3202 Encounter for pregnancy test, result negative: Secondary | ICD-10-CM

## 2022-01-30 DIAGNOSIS — Z30014 Encounter for initial prescription of intrauterine contraceptive device: Secondary | ICD-10-CM | POA: Diagnosis not present

## 2022-01-30 LAB — POCT URINE PREGNANCY: Preg Test, Ur: NEGATIVE

## 2022-01-30 MED ORDER — LEVONORGESTREL 20 MCG/DAY IU IUD
1.0000 | INTRAUTERINE_SYSTEM | Freq: Once | INTRAUTERINE | Status: AC
  Administered 2022-01-30: 1 via INTRAUTERINE

## 2022-01-30 NOTE — Patient Instructions (Signed)

## 2022-01-30 NOTE — Progress Notes (Signed)
    GYNECOLOGY OFFICE PROCEDURE NOTE  Natalie Davis is a 47 y.o. [id number] here for mirena IUD insertion. No GYN concerns.  Last pap smear was on 01/28/2021 and was normal.  IUD Insertion Procedure Note Patient identified, informed consent performed, consent signed.   Discussed risks of irregular bleeding, cramping, infection, malpositioning or misplacement of the IUD outside the uterus which may require further procedure such as laparoscopy. Also discussed >99% contraception efficacy, increased risk of ectopic pregnancy with failure of method.   Emphasized that this did not protect against STIs, condoms recommended during all sexual encounters. Time out was performed.  Urine pregnancy test negative.  Speculum placed in the vagina.  Cervix visualized.  Cleaned with Betadine x 2.  Grasped anteriorly with a single tooth tenaculum.  Uterus sounded to 9 cm.  Mirena IUD placed per manufacturer's recommendations.  Strings trimmed to 3 cm. Tenaculum was removed, good hemostasis noted.  Patient tolerated procedure well.   Patient was given post-procedure instructions.  She was advised to have backup contraception for one week.  Patient was also asked to check IUD strings periodically and follow up in 4 weeks for IUD check.  Philip Aspen, CNM

## 2022-01-30 NOTE — Addendum Note (Signed)
Addended by: Minette Headland on: 01/30/2022 03:17 PM   Modules accepted: Orders

## 2022-02-02 ENCOUNTER — Encounter: Payer: Managed Care, Other (non HMO) | Admitting: Certified Nurse Midwife

## 2022-02-02 ENCOUNTER — Encounter: Payer: Self-pay | Admitting: Certified Nurse Midwife

## 2022-02-02 ENCOUNTER — Ambulatory Visit: Payer: Managed Care, Other (non HMO) | Admitting: Obstetrics and Gynecology

## 2022-02-23 ENCOUNTER — Ambulatory Visit
Admission: RE | Admit: 2022-02-23 | Discharge: 2022-02-23 | Disposition: A | Discharge status: Home / Self Care | Payer: Managed Care, Other (non HMO) | Source: Ambulatory Visit | Attending: Certified Nurse Midwife | Admitting: Certified Nurse Midwife

## 2022-02-23 DIAGNOSIS — Z1231 Encounter for screening mammogram for malignant neoplasm of breast: Secondary | ICD-10-CM | POA: Diagnosis not present

## 2022-02-27 ENCOUNTER — Encounter: Payer: Self-pay | Admitting: Certified Nurse Midwife

## 2022-05-27 ENCOUNTER — Ambulatory Visit: Payer: Managed Care, Other (non HMO) | Admitting: Certified Nurse Midwife

## 2022-05-27 ENCOUNTER — Encounter: Payer: Self-pay | Admitting: Certified Nurse Midwife

## 2022-05-27 VITALS — BP 105/68 | HR 45 | Ht 60.0 in | Wt 141.4 lb

## 2022-05-27 DIAGNOSIS — Z01419 Encounter for gynecological examination (general) (routine) without abnormal findings: Secondary | ICD-10-CM

## 2022-05-27 DIAGNOSIS — Z1231 Encounter for screening mammogram for malignant neoplasm of breast: Secondary | ICD-10-CM

## 2022-05-27 NOTE — Progress Notes (Signed)
GYNECOLOGY ANNUAL PREVENTATIVE CARE ENCOUNTER NOTE  History:     Natalie Davis is a 47 y.o. G85P2002 female here for a routine annual gynecologic exam.  Current complaints: none.   Denies abnormal vaginal bleeding, discharge, pelvic pain, problems with intercourse or other gynecologic concerns.     Social Relationship: married  Living: spouse and 2 daughters ( 14 &15 yr) Work: Charity fundraiser an Brewing technologist Exercise: Walking daily  Smoke/Alcohol/drug use: occasional alcohol use   Gynecologic History No LMP recorded. (Menstrual status: IUD). Contraception: IUD and vasectomy Last Pap: 01/28/2021. Results were: normal with negative HPV Last mammogram: 02/25/2022. Results were: normal  Obstetric History OB History  Gravida Para Term Preterm AB Living  2 2 2     2   SAB IAB Ectopic Multiple Live Births          2    # Outcome Date GA Lbr Len/2nd Weight Sex Delivery Anes PTL Lv  2 Term 05/10/08   8 lb 1 oz (3.657 kg) F Vag-Spont  N LIV  1 Term 10/04/06   6 lb 4 oz (2.835 kg) F Vag-Spont  N LIV    History reviewed. No pertinent past medical history.  Past Surgical History:  Procedure Laterality Date   APPENDECTOMY      Current Outpatient Medications on File Prior to Visit  Medication Sig Dispense Refill   levonorgestrel (MIRENA) 20 MCG/DAY IUD 1 each by Intrauterine route once.     No current facility-administered medications on file prior to visit.    No Known Allergies  Social History:  reports that she has never smoked. She has never used smokeless tobacco. She reports current alcohol use. She reports that she does not use drugs.  Family History  Problem Relation Age of Onset   Emphysema Paternal Grandfather    Breast cancer Maternal Grandmother     The following portions of the patient's history were reviewed and updated as appropriate: allergies, current medications, past family history, past medical history, past social history, past surgical history and  problem list.  Review of Systems Pertinent items noted in HPI and remainder of comprehensive ROS otherwise negative.  Physical Exam:  BP 105/68   Pulse (!) 45   Ht 5' (1.524 m)   Wt 141 lb 6.4 oz (64.1 kg)   BMI 27.62 kg/m  CONSTITUTIONAL: Well-developed, well-nourished female in no acute distress.  HENT:  Normocephalic, atraumatic, External right and left ear normal. Oropharynx is clear and moist EYES: Conjunctivae and EOM are normal. Pupils are equal, round, and reactive to light. No scleral icterus.  NECK: Normal range of motion, supple, no masses.  Normal thyroid.  SKIN: Skin is warm and dry. No rash noted. Not diaphoretic. No erythema. No pallor. MUSCULOSKELETAL: Normal range of motion. No tenderness.  No cyanosis, clubbing, or edema.  2+ distal pulses. NEUROLOGIC: Alert and oriented to person, place, and time. Normal reflexes, muscle tone coordination.  PSYCHIATRIC: Normal mood and affect. Normal behavior. Normal judgment and thought content. CARDIOVASCULAR: Normal heart rate noted, regular rhythm RESPIRATORY: Clear to auscultation bilaterally. Effort and breath sounds normal, no problems with respiration noted. BREASTS: Symmetric in size. No masses, tenderness, skin changes, nipple drainage, or lymphadenopathy bilaterally.  ABDOMEN: Soft, no distention noted.  No tenderness, rebound or guarding.  PELVIC: Normal appearing external genitalia and urethral meatus; normal appearing vaginal mucosa and cervix.  No abnormal discharge noted.  Pap smear not due.  Normal uterine size, no other palpable masses,  no uterine or adnexal tenderness.  .   Assessment and Plan:    1. Women's annual routine gynecological examination    Pap: not due  Mammogram : ordered  Labs: none  Refills: none ( not currently taking Busbar-her mood has been good) Referral: none  Routine preventative health maintenance measures emphasized. Please refer to After Visit Summary for other counseling  recommendations.      Doreene Burke, CNM [facility]  [facility],  [facility]

## 2023-03-03 ENCOUNTER — Ambulatory Visit
Admission: RE | Admit: 2023-03-03 | Discharge: 2023-03-03 | Disposition: A | Discharge status: Home / Self Care | Payer: Managed Care, Other (non HMO) | Source: Ambulatory Visit | Attending: Certified Nurse Midwife | Admitting: Certified Nurse Midwife

## 2023-03-03 DIAGNOSIS — Z01419 Encounter for gynecological examination (general) (routine) without abnormal findings: Secondary | ICD-10-CM

## 2023-03-03 DIAGNOSIS — Z1231 Encounter for screening mammogram for malignant neoplasm of breast: Secondary | ICD-10-CM | POA: Insufficient documentation

## 2023-10-19 IMAGING — MG DIGITAL DIAGNOSTIC BILAT W/ TOMO W/ CAD
8 series · 8 of 24 positions shown · non-contrast
Comparison: Previous exams from November 2018.

CLINICAL DATA: Interval follow-up of a likely benign focal
asymmetry involving the UPPER INNER QUADRANT of the RIGHT breast at
posterior depth identified on baseline screening mammography in
November 2018. The patient returns now for follow-up. Annual
evaluation, LEFT breast.

EXAM:
DIGITAL DIAGNOSTIC BILATERAL MAMMOGRAM WITH TOMOSYNTHESIS AND CAD
TECHNIQUE: Bilateral digital diagnostic mammography and breast tomosynthesis
was performed. The images were evaluated with computer-aided
detection.

[R CC synth-2D]
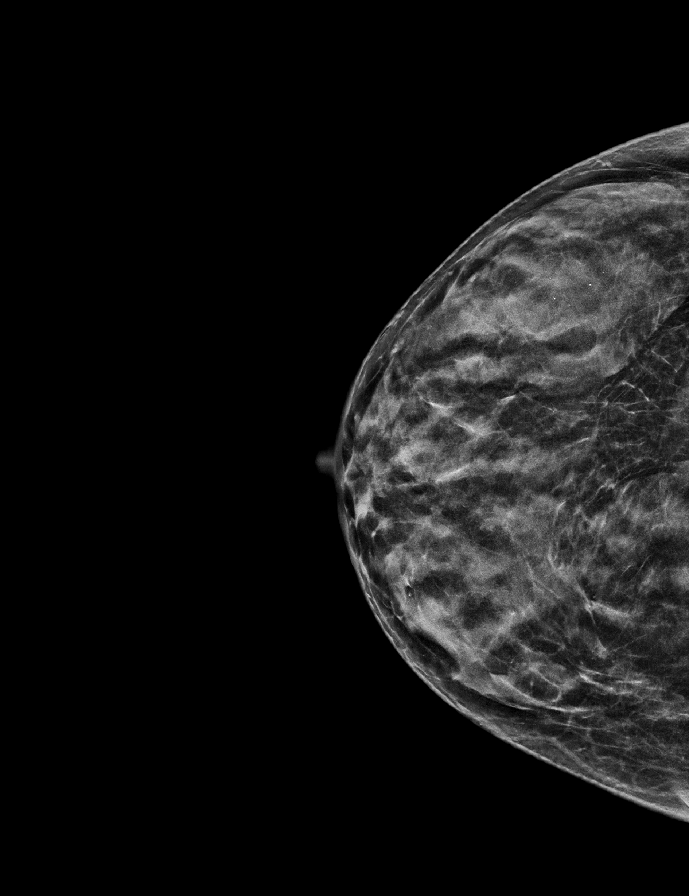

[L CC synth-2D]
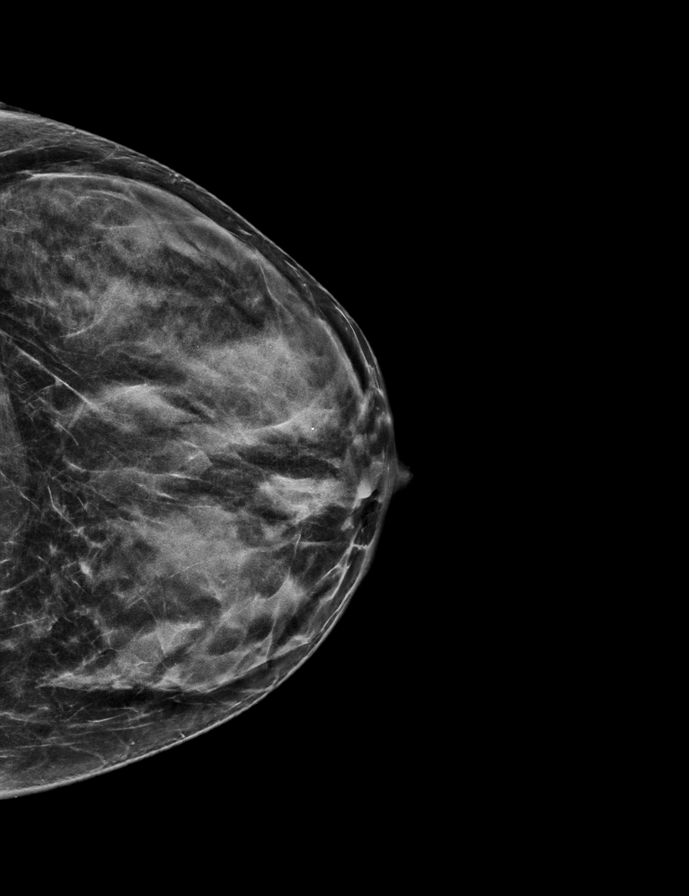

[R MLO synth-2D]
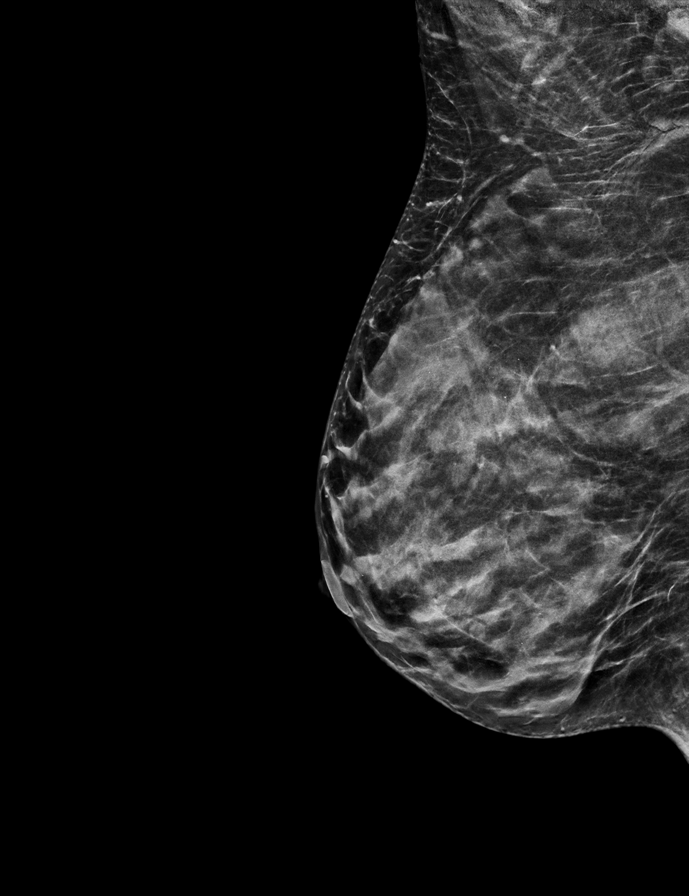

[L MLO synth-2D]
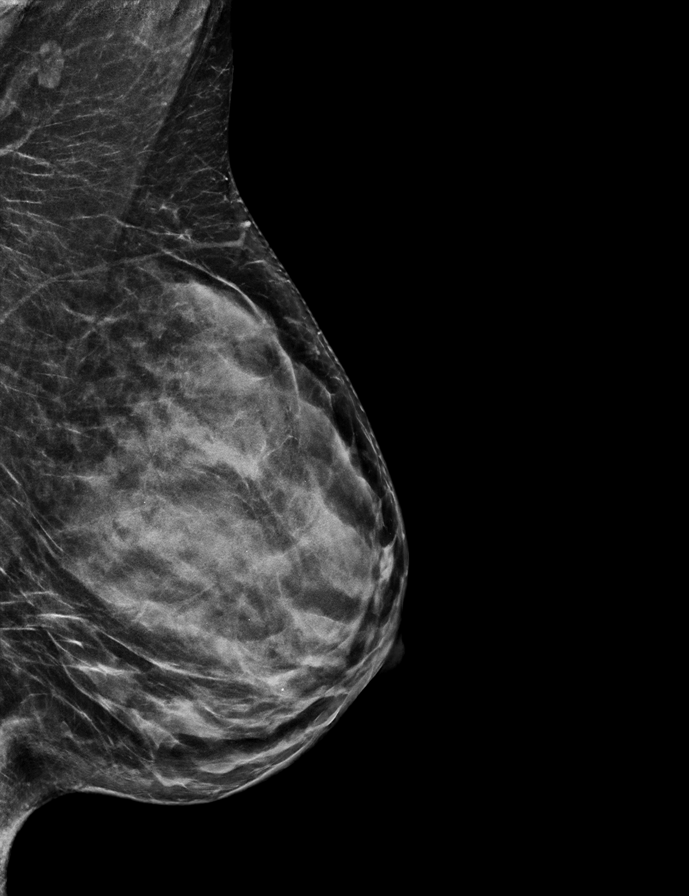

[R MLO tomo · tomo slice 27/54.0]
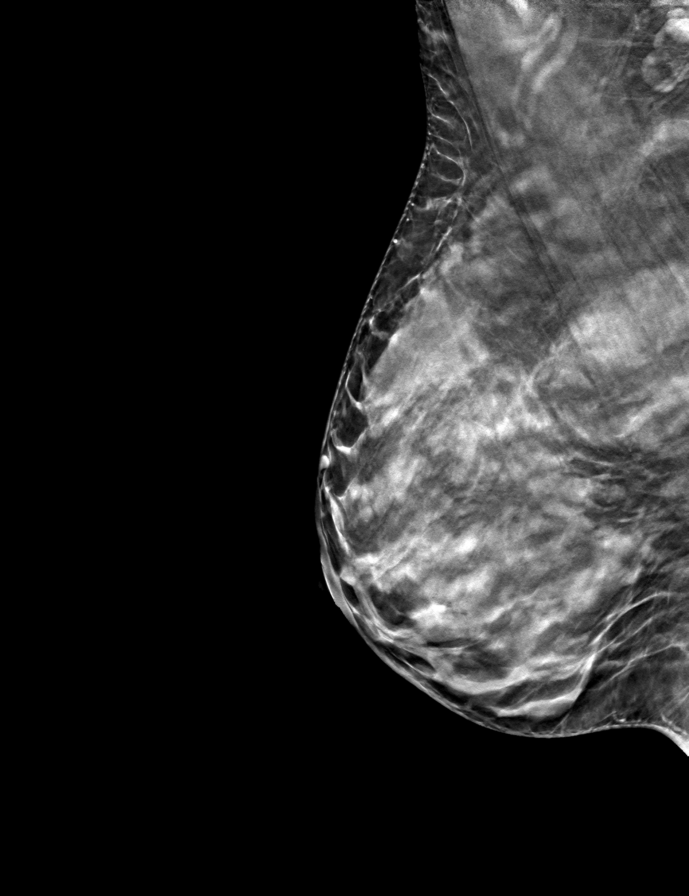

[L MLO tomo · tomo slice 28/55.0]
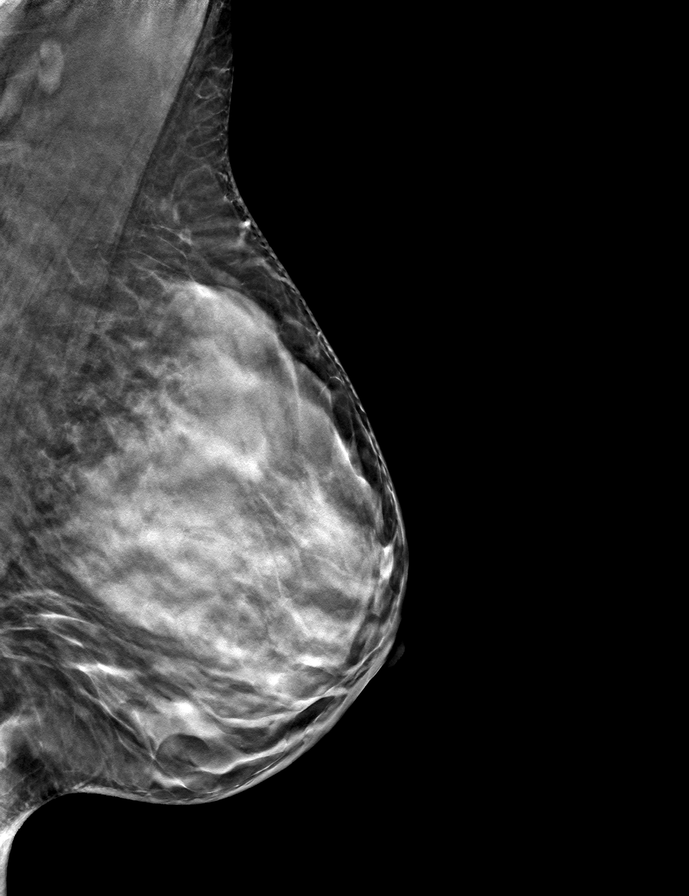

[R CC tomo · tomo slice 25/48.0]
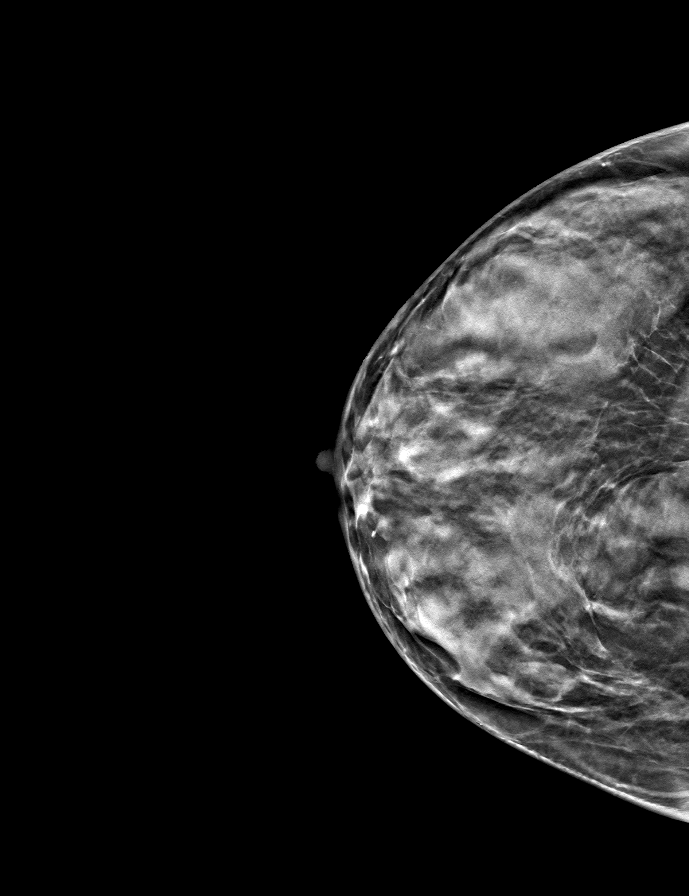

[L CC tomo · tomo slice 27/54.0]
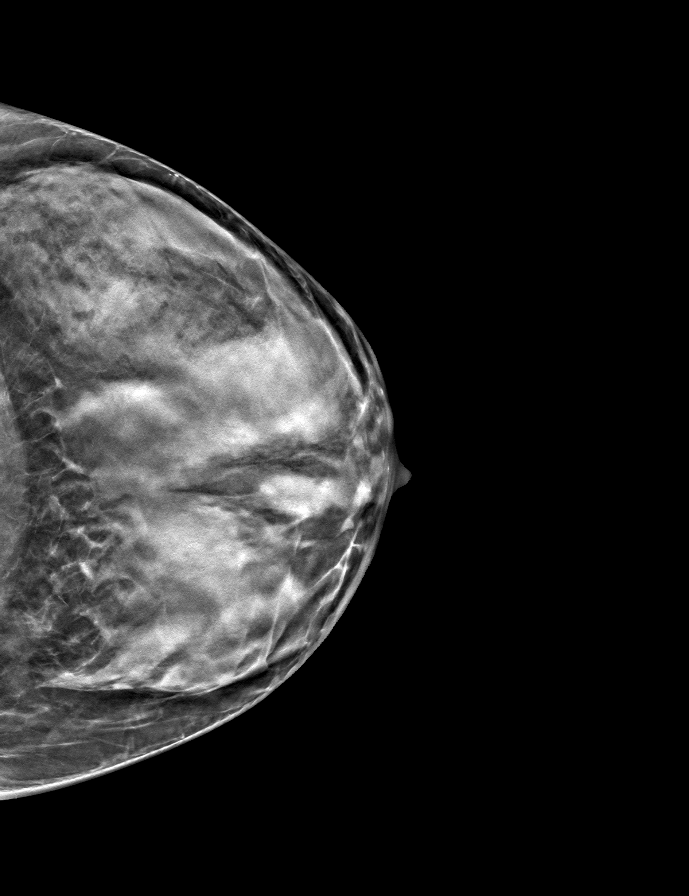

[8 of 24 positions shown; findings below may reference images not displayed]

ACR Breast Density Category d: The breast tissue is extremely dense,
which lowers the sensitivity of mammography.
FINDINGS: Full field CC and MLO views of both breasts were obtained.

RIGHT: No findings suspicious for malignancy. The focal asymmetry in
the UPPER INNER QUADRANT at posterior depth is unchanged dating back
to November 2018, confirming benignity, likely isolated
fibroglandular tissue.

LEFT: No findings suspicious for malignancy.
IMPRESSION: No mammographic evidence of malignancy involving either breast.

RECOMMENDATION:
Screening mammogram in one year.(Code:[id number])

I have discussed the findings and recommendations with the patient.
If applicable, a reminder letter will be sent to the patient
regarding the next appointment.

BI-RADS CATEGORY  2: Benign.

## 2024-01-12 ENCOUNTER — Other Ambulatory Visit: Payer: Self-pay | Admitting: Certified Nurse Midwife

## 2024-01-12 DIAGNOSIS — Z1231 Encounter for screening mammogram for malignant neoplasm of breast: Secondary | ICD-10-CM

## 2024-02-04 NOTE — Patient Instructions (Signed)
 Preventive Care 66-47 Years Old, Female Preventive care refers to lifestyle choices and visits with your health care provider that can promote health and wellness. Preventive care visits are also called wellness exams. What can I expect for my preventive care visit? Counseling Your health care provider may ask you questions about your: Medical history, including: Past medical problems. Family medical history. Pregnancy history. Current health, including: Menstrual cycle. Method of birth control. Emotional well-being. Home life and relationship well-being. Sexual activity and sexual health. Lifestyle, including: Alcohol, nicotine or tobacco, and drug use. Access to firearms. Diet, exercise, and sleep habits. Work and work Astronomer. Sunscreen use. Safety issues such as seatbelt and bike helmet use. Physical exam Your health care provider will check your: Height and weight. These may be used to calculate your BMI (body mass index). BMI is a measurement that tells if you are at a healthy weight. Waist circumference. This measures the distance around your waistline. This measurement also tells if you are at a healthy weight and may help predict your risk of certain diseases, such as type 2 diabetes and high blood pressure. Heart rate and blood pressure. Body temperature. Skin for abnormal spots. What immunizations do I need?  Vaccines are usually given at various ages, according to a schedule. Your health care provider will recommend vaccines for you based on your age, medical history, and lifestyle or other factors, such as travel or where you work. What tests do I need? Screening Your health care provider may recommend screening tests for certain conditions. This may include: Lipid and cholesterol levels. Diabetes screening. This is done by checking your blood sugar (glucose) after you have not eaten for a while (fasting). Pelvic exam and Pap test. Hepatitis B test. Hepatitis C  test. HIV (human immunodeficiency virus) test. STI (sexually transmitted infection) testing, if you are at risk. Lung cancer screening. Colorectal cancer screening. Mammogram. Talk with your health care provider about when you should start having regular mammograms. This may depend on whether you have a family history of breast cancer. BRCA-related cancer screening. This may be done if you have a family history of breast, ovarian, tubal, or peritoneal cancers. Bone density scan. This is done to screen for osteoporosis. Talk with your health care provider about your test results, treatment options, and if necessary, the need for more tests. Follow these instructions at home: Eating and drinking  Eat a diet that includes fresh fruits and vegetables, whole grains, lean protein, and low-fat dairy products. Take vitamin and mineral supplements as recommended by your health care provider. Do not drink alcohol if: Your health care provider tells you not to drink. You are pregnant, may be pregnant, or are planning to become pregnant. If you drink alcohol: Limit how much you have to 0-1 drink a day. Know how much alcohol is in your drink. In the [place] one drink equals one 12 oz bottle of beer (355 mL), one 5 oz glass of wine (148 mL), or one 1 oz glass of hard liquor (44 mL). Lifestyle Brush your teeth every morning and night with fluoride toothpaste. Floss one time each day. Exercise for at least 30 minutes 5 or more days each week. Do not use any products that contain nicotine or tobacco. These products include cigarettes, chewing tobacco, and vaping devices, such as e-cigarettes. If you need help quitting, ask your health care provider. Do not use drugs. If you are sexually active, practice safe sex. Use a condom or other form of protection to  prevent STIs. If you do not wish to become pregnant, use a form of birth control. If you plan to become pregnant, see your health care provider for a  prepregnancy visit. Take aspirin only as told by your health care provider. Make sure that you understand how much to take and what form to take. Work with your health care provider to find out whether it is safe and beneficial for you to take aspirin daily. Find healthy ways to manage stress, such as: Meditation, yoga, or listening to music. Journaling. Talking to a trusted person. Spending time with friends and family. Minimize exposure to UV radiation to reduce your risk of skin cancer. Safety Always wear your seat belt while driving or riding in a vehicle. Do not drive: If you have been drinking alcohol. Do not ride with someone who has been drinking. When you are tired or distracted. While texting. If you have been using any mind-altering substances or drugs. Wear a helmet and other protective equipment during sports activities. If you have firearms in your house, make sure you follow all gun safety procedures. Seek help if you have been physically or sexually abused. What's next? Visit your health care provider once a year for an annual wellness visit. Ask your health care provider how often you should have your eyes and teeth checked. Stay up to date on all vaccines. This information is not intended to replace advice given to you by your health care provider. Make sure you discuss any questions you have with your health care provider. Document Revised: 06/19/2020 Document Reviewed: 06/19/2020 Elsevier Patient Education  2024 [employer]. How to Do a Breast Self-Exam Doing breast self-exams can help you stay healthy. They're one way to know what's normal for your breasts. They can help you catch a problem while it's still small and can be treated. You need to: Check your breasts often. Tell your doctor about any changes. You should do breast self-exams even if you have breast implants. What you need: A mirror. A well-lit room. A pillow or other soft object. How to do a  breast self-exam Look for changes  Take off all the clothes above your waist. Stand in front of a mirror in a room with good lighting. Put your hands down at your sides. Compare your breasts in the mirror. Look for difference between them, such as: Differences in shape. Differences in size. Wrinkles, dips, and bumps in one breast and not the other. Look at each breast for skin changes, such as: Redness. Scaly spots. Spots where your skin is thicker. [place]. Open sores. Look for changes in your nipples, such as: Fluid coming out of a nipple. Fluid around a nipple. Bleeding. Dimpling. Redness. A nipple that looks pushed in or that has changed position. Feel for changes Lie on your back. Feel each breast. To do this: Pick a breast to feel. Place a pillow under the shoulder closest to that breast. Put the arm closest to that breast behind your head. Feel the breast using the hand of your other arm. Use the pads of your three middle fingers to make small circles starting near the nipple. Use light, medium, and firm pressure. Keep making circles, moving down over the breast. Stop when you feel your ribs. Start making circles with your fingers again, this time going up until you reach your collarbone. Then, make circles out across your breast and into your armpit area. Squeeze your nipple. Check for fluid and lumps. Do these steps again  to check your other breast. Sit or stand in the tub or shower. With soapy water on your skin, feel each breast the same way you did when you were lying down. Write down what you find Writing down what you find can help you keep track of what you want to tell your doctor. Write down: What's normal for each breast. Any changes you find. Write down: The kind of change. If your breast feels tender or painful. Any lump you find. Write down its size and where it is. When you last had your period. General tips If you're breastfeeding, the best time  to check your breasts is after you feed your baby or after you use a breast pump. If you get a period, the best time to check your breasts is 5-7 days after your period ends. With time, you'll get more used to doing the self-exam. You'll also start to know if there are changes in your breasts. Contact a doctor if: You see a change in the shape or size of your breasts or nipples. You see a change in the skin of your breast or nipples. You have fluid coming from your nipples that isn't normal. You find a new lump or thick area. You have breast pain. You have any concerns about your breast health. This information is not intended to replace advice given to you by your health care provider. Make sure you discuss any questions you have with your health care provider. Document Revised: 03/03/2023 Document Reviewed: 03/03/2023 Elsevier Patient Education  2025 [employer].

## 2024-02-04 NOTE — Progress Notes (Unsigned)
 "        GYNECOLOGY ANNUAL PREVENTATIVE CARE ENCOUNTER NOTE  History:     Natalie Davis is a 49 y.o. G80P2002 female here for a routine annual gynecologic exam.  Current complaints: ***.   Denies abnormal vaginal bleeding, discharge, pelvic pain, problems with intercourse or other gynecologic concerns.     Social Relationship: Living: Work: Exercise: Smoke/Alcohol/drug use:  Gynecologic History No LMP recorded. (Menstrual status: IUD). Contraception: {method:5051} Last Pap: 01/28/21. Results were: normal with negative HPV Last mammogram: 03/03/23. Results were: normal  Obstetric History OB History  Gravida Para Term Preterm AB Living  2 2 2   2   SAB IAB Ectopic Multiple Live Births      2    # Outcome Date GA Lbr Len/2nd Weight Sex Type Anes PTL Lv  2 Term 05/10/08   8 lb 1 oz (3.657 kg) F Vag-Spont  N LIV  1 Term 10/04/06   6 lb 4 oz (2.835 kg) F Vag-Spont  N LIV    No past medical history on file.  Past Surgical History:  Procedure Laterality Date   APPENDECTOMY      Medications Ordered Prior to Encounter[1]  Allergies[2]  Social History:  reports that she has never smoked. She has never used smokeless tobacco. She reports current alcohol use. She reports that she does not use drugs.  Family History  Problem Relation Age of Onset   Emphysema Paternal Grandfather    Breast cancer Maternal Grandmother     The following portions of the patient's history were reviewed and updated as appropriate: allergies, current medications, past family history, past medical history, past social history, past surgical history and problem list.  Review of Systems Pertinent items noted in HPI and remainder of comprehensive ROS otherwise negative.  Physical Exam:  There were no vitals taken for this visit. CONSTITUTIONAL: Well-developed, well-nourished female in no acute distress.  HENT:  Normocephalic, atraumatic, External right and left ear normal. Oropharynx is clear  and moist EYES: Conjunctivae and EOM are normal. Pupils are equal, round, and reactive to light. No scleral icterus.  NECK: Normal range of motion, supple, no masses.  Normal thyroid .  SKIN: Skin is warm and dry. No rash noted. Not diaphoretic. No erythema. No pallor. MUSCULOSKELETAL: Normal range of motion. No tenderness.  No cyanosis, clubbing, or edema.  2+ distal pulses. NEUROLOGIC: Alert and oriented to person, place, and time. Normal reflexes, muscle tone coordination.  PSYCHIATRIC: Normal mood and affect. Normal behavior. Normal judgment and thought content. CARDIOVASCULAR: Normal heart rate noted, regular rhythm RESPIRATORY: Clear to auscultation bilaterally. Effort and breath sounds normal, no problems with respiration noted. BREASTS: Symmetric in size. No masses, tenderness, skin changes, nipple drainage, or lymphadenopathy bilaterally.  ABDOMEN: Soft, no distention noted.  No tenderness, rebound or guarding.  PELVIC: Normal appearing external genitalia and urethral meatus; normal appearing vaginal mucosa and cervix.  No abnormal discharge noted.  Pap smear obtained.  Normal uterine size, no other palpable masses, no uterine or adnexal tenderness.  .   Assessment and Plan:    There are no diagnoses linked to this encounter. Will follow up results of pap smear and manage accordingly. Pap: Mammogram : Labs: Refills: Referral: Routine preventative health maintenance measures emphasized. Please refer to After Visit Summary for other counseling recommendations.      Zelda Hummer, CNM [facility]  [facility],  [facility]     [1]  Current Outpatient Medications on File Prior to  Visit  Medication Sig Dispense Refill   levonorgestrel  (MIRENA ) 20 MCG/DAY IUD 1 each by Intrauterine route once.     No current facility-administered medications on file prior to visit.  [2] No Known Allergies  "

## 2024-02-10 ENCOUNTER — Ambulatory Visit: Admitting: Certified Nurse Midwife

## 2024-02-10 ENCOUNTER — Encounter: Payer: Self-pay | Admitting: Certified Nurse Midwife

## 2024-02-10 VITALS — BP 86/46 | HR 52 | Ht 60.0 in | Wt 136.0 lb

## 2024-02-10 DIAGNOSIS — Z1231 Encounter for screening mammogram for malignant neoplasm of breast: Secondary | ICD-10-CM

## 2024-02-10 DIAGNOSIS — Z01419 Encounter for gynecological examination (general) (routine) without abnormal findings: Secondary | ICD-10-CM

## 2024-03-03 ENCOUNTER — Encounter
